# Patient Record
Sex: Female | Born: 1965 | Race: White | Hispanic: No | Marital: Married | Smoking: Current every day smoker
Health system: Southern US, Community
[De-identification: ages and names within clinical notes are randomized; demographics above are authoritative.]

## PROBLEM LIST (undated history)

## (undated) DIAGNOSIS — F329 Major depressive disorder, single episode, unspecified: Secondary | ICD-10-CM

## (undated) DIAGNOSIS — F419 Anxiety disorder, unspecified: Secondary | ICD-10-CM

## (undated) DIAGNOSIS — F32A Depression, unspecified: Secondary | ICD-10-CM

## (undated) HISTORY — DX: Major depressive disorder, single episode, unspecified: F32.9

## (undated) HISTORY — DX: Depression, unspecified: F32.A

## (undated) HISTORY — PX: ECTOPIC PREGNANCY SURGERY: SHX613

## (undated) HISTORY — DX: Anxiety disorder, unspecified: F41.9

---

## 2001-09-22 ENCOUNTER — Emergency Department (HOSPITAL_COMMUNITY): Admission: EM | Admit: 2001-09-22 | Discharge: 2001-09-22 | Payer: Self-pay | Admitting: Emergency Medicine

## 2001-09-22 ENCOUNTER — Encounter: Payer: Self-pay | Admitting: Emergency Medicine

## 2005-07-01 ENCOUNTER — Encounter: Payer: Self-pay | Admitting: Emergency Medicine

## 2005-07-02 ENCOUNTER — Inpatient Hospital Stay (HOSPITAL_COMMUNITY): Admission: EM | Admit: 2005-07-02 | Discharge: 2005-07-05 | Payer: Self-pay | Admitting: Internal Medicine

## 2005-07-08 ENCOUNTER — Inpatient Hospital Stay (HOSPITAL_COMMUNITY): Admission: EM | Admit: 2005-07-08 | Discharge: 2005-07-14 | Payer: Self-pay | Admitting: Emergency Medicine

## 2007-06-21 IMAGING — CR DG ABDOMEN 2V
2 series · 2 of 2 positions shown · non-contrast
Comparison: Most recently 07/03/05

CLINICAL DATA: History resolving partial SBO.
 ABDOMEN ? 2 VIEW:

[w abdomen upright]
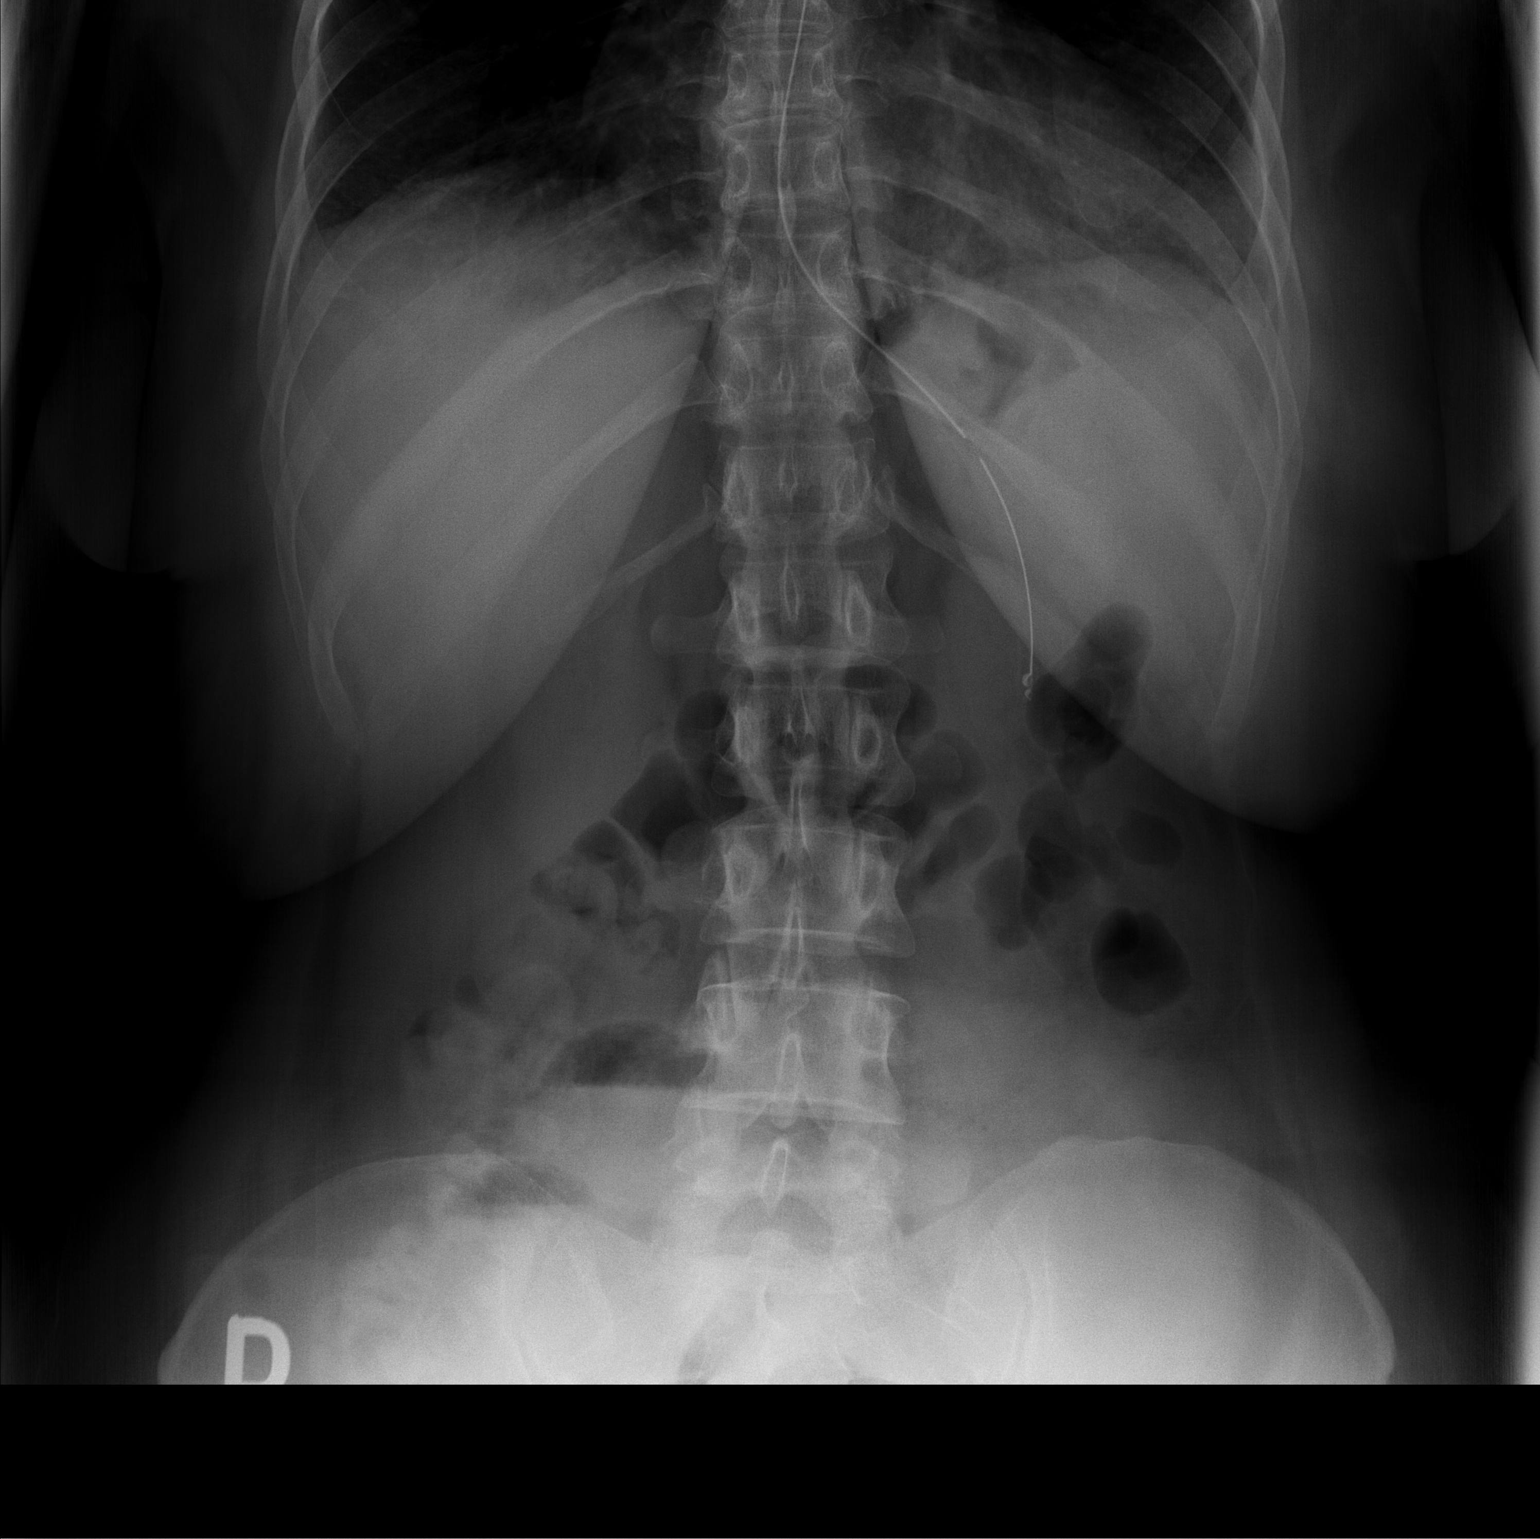

[t abdomen supine]
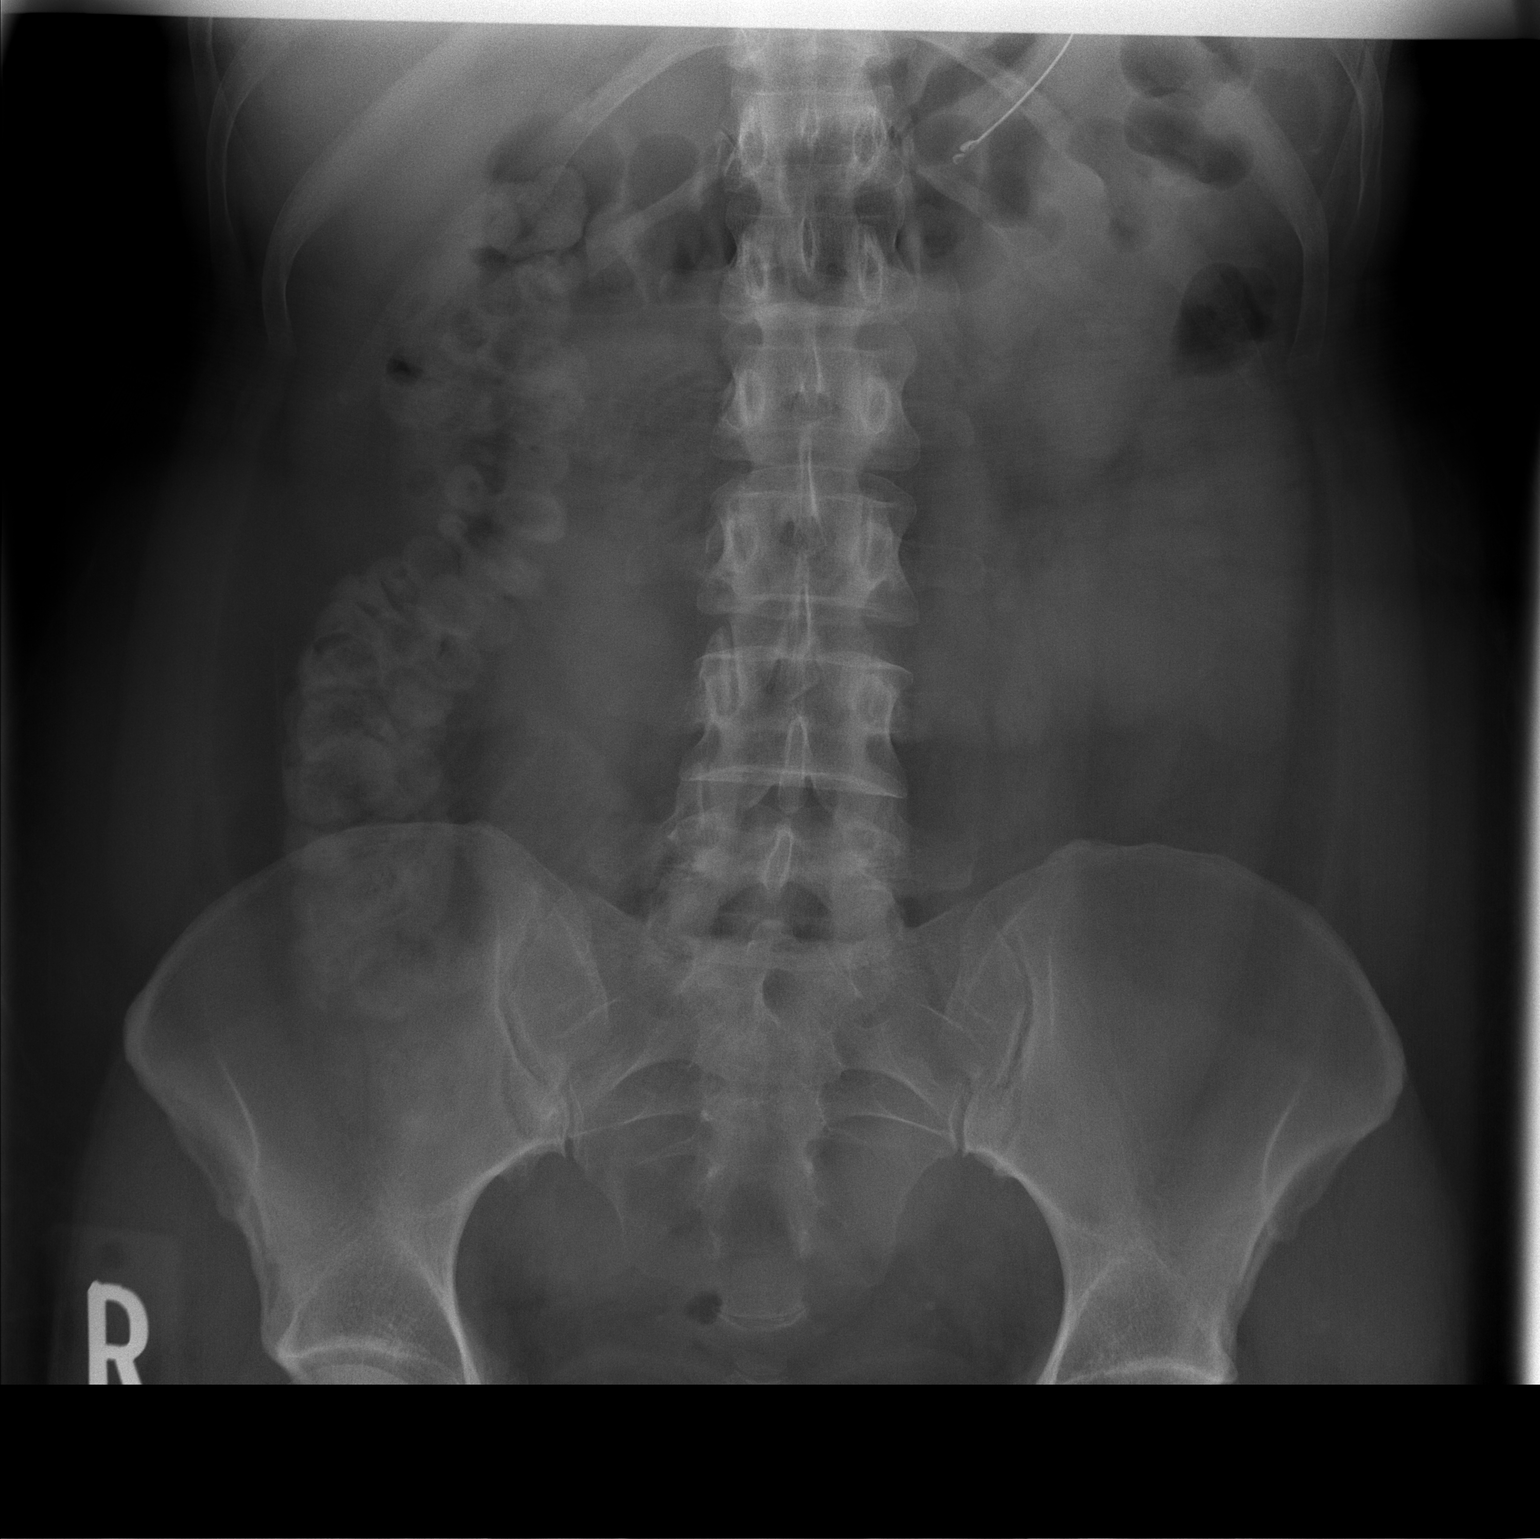

[2 of 2 positions shown; findings below may reference images not displayed]

FINDINGS: Nasogastric tube remains in the stomach.  The gas pattern is nearly normal today.  There are a few slightly prominent small bowel loops with air fluid levels but this continues to improve.  No sign of free air.  There is mild basilar atelectasis.
IMPRESSION: Continued radiographic improvement.

## 2007-06-25 IMAGING — CR DG ABDOMEN ACUTE W/ 1V CHEST
3 series · 3 of 3 positions shown · non-contrast
Comparison: none

CLINICAL DATA: Recent small bowel obstruction, onset of epigastric pain with bloating.  
 ACUTE ABDOMINAL SERIES:
 Chest - a single view of the chest shows the lungs to be clear.  The heart is within normal limits in size.  
 Abdomen - compared to films of 07/04/2005, there are scattered air-fluid levels in somewhat distended loops of small bowel.  This is suggestive of a persistent partial SBO pattern.  No free air is seen.

[w chest pa]
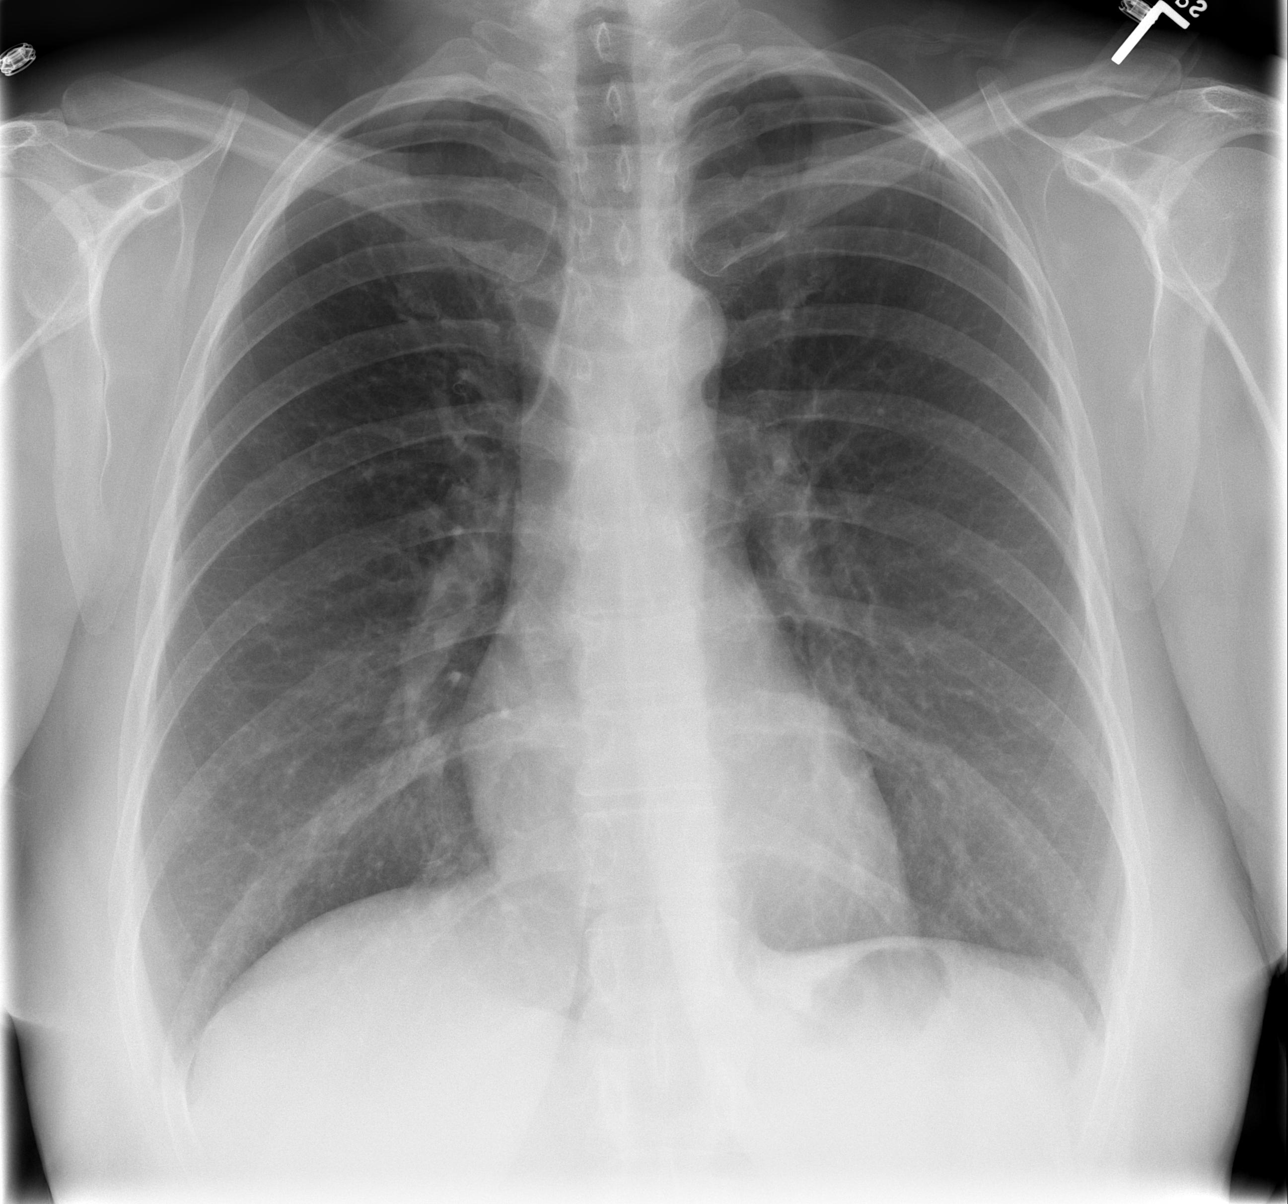

[w abdomen upright]
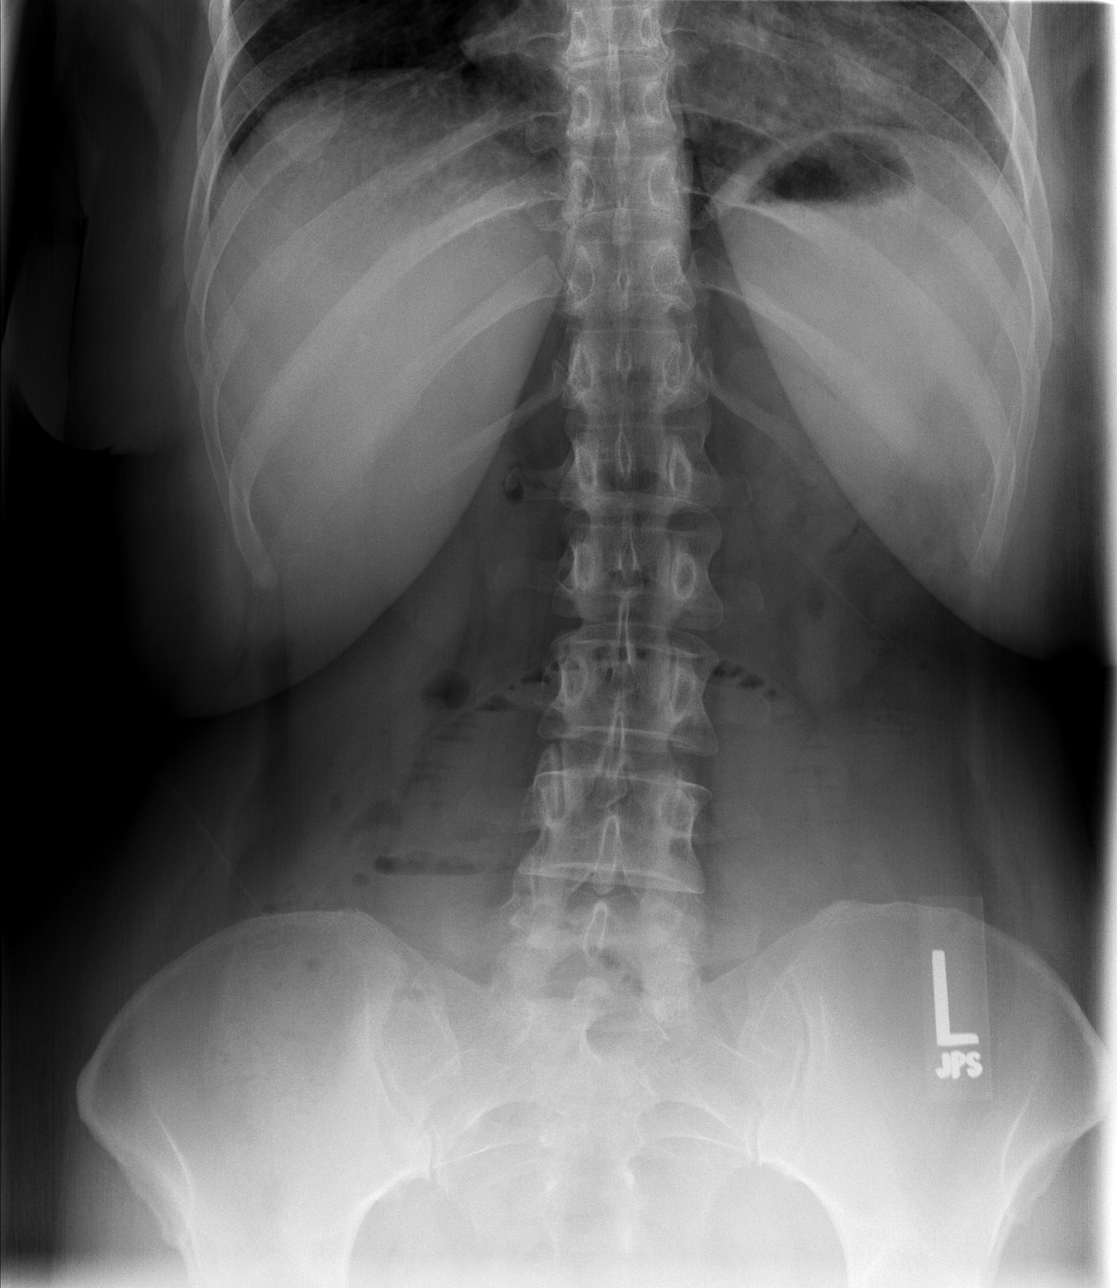

[t abdomen supine]
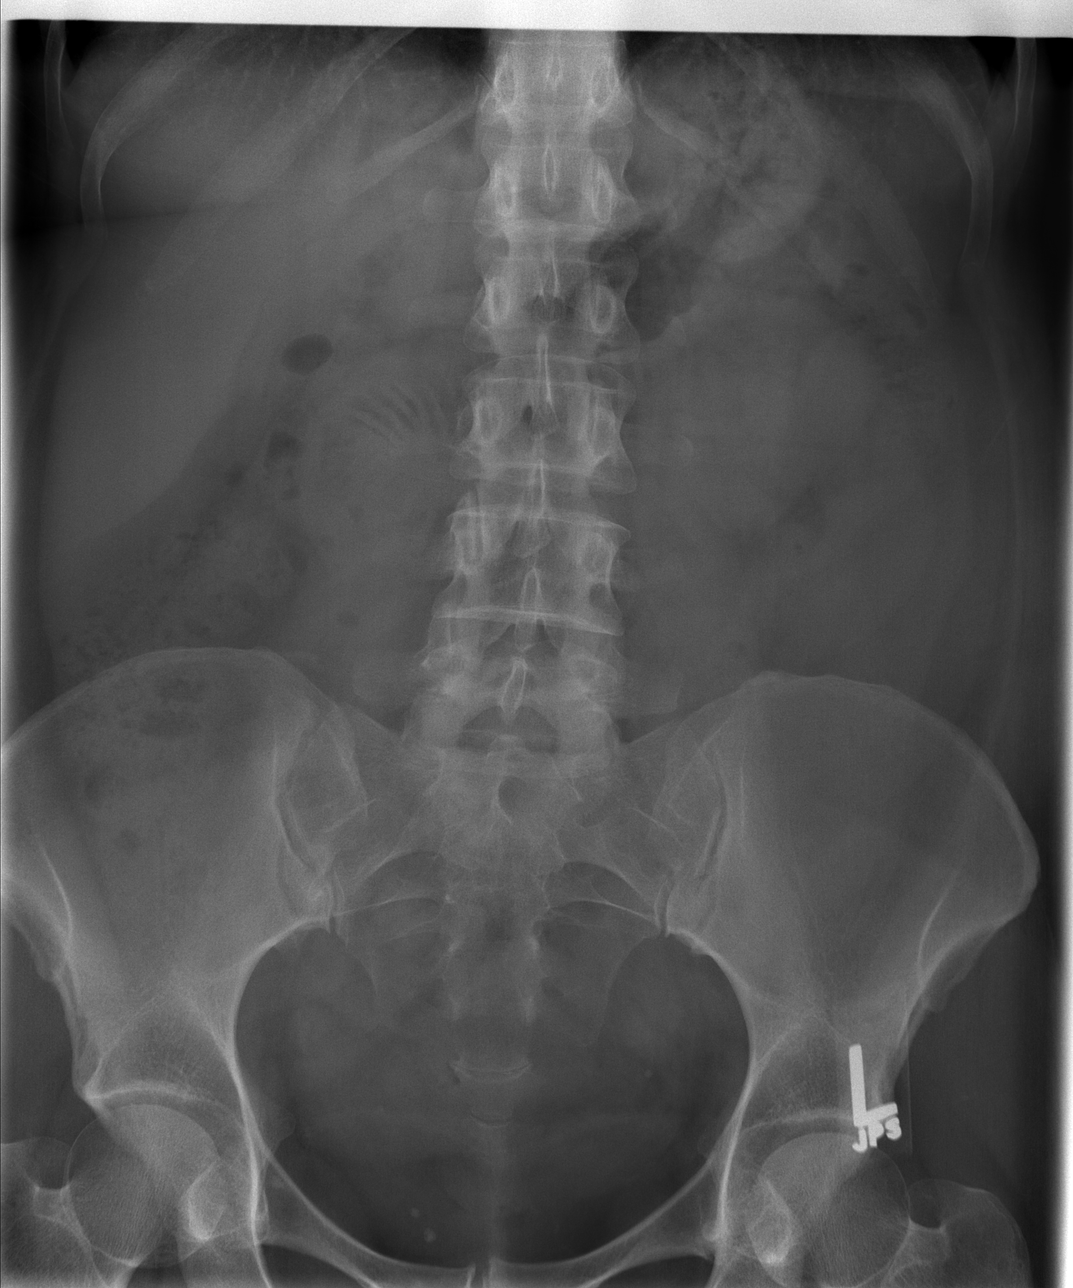

[3 of 3 positions shown; findings below may reference images not displayed]

IMPRESSION: 1.  Suspect persistent partial SBO with fluid-filled small bowel loops.  No free air. 
 2.  No active lung disease.

## 2007-06-25 IMAGING — CT CT PELVIS W/ CM
2 of 5 series · 16 of 46 positions shown, 18 images · IV contrast (APPLIED)
Comparison: Prior CT of 07/01/05.

CLINICAL DATA: Recent bowel obstruction now with similar symptoms of pain, distention, nausea, and vomiting. 
ABDOMEN CT WITH CONTRAST:
TECHNIQUE: Multidetector CT imaging of the abdomen was performed following the standard protocol during bolus administration of intravenous contrast.
Contrast:  Oral contrast and 100 cc Omnipaque 300
TECHNIQUE: Multidetector CT imaging of the pelvis was performed following the standard protocol during bolus administration of intravenous contrast.

[Series 3: abd/pelv with 5.0 b31f st · axial · 0.76mm/px · z∈[-467,-72]mm · 13 of 89 slices shown, 15 images]
[im 5/89  soft-tissue]
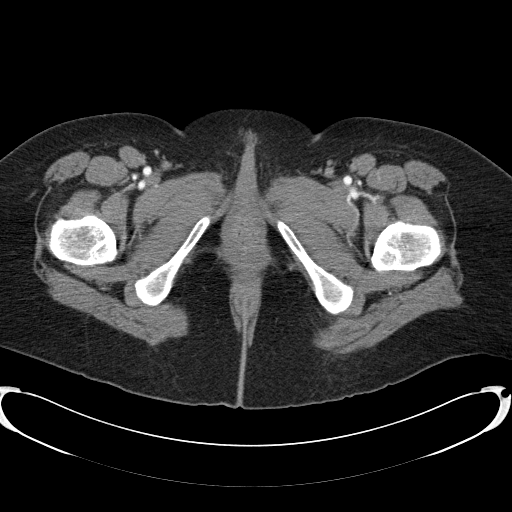
[im 5/89  bone]
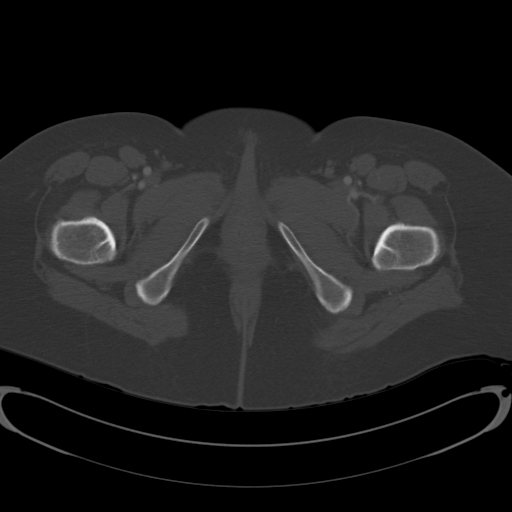
[im 14/89  soft-tissue]
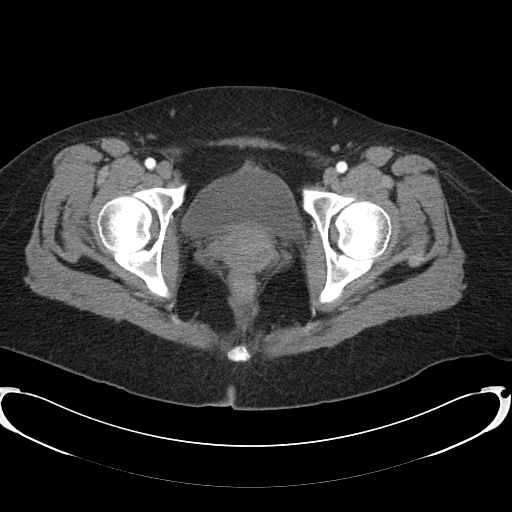
[im 19/89  soft-tissue]
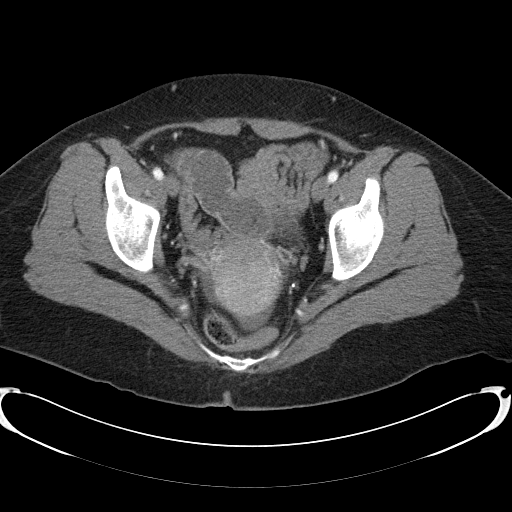
[im 24/89  soft-tissue]
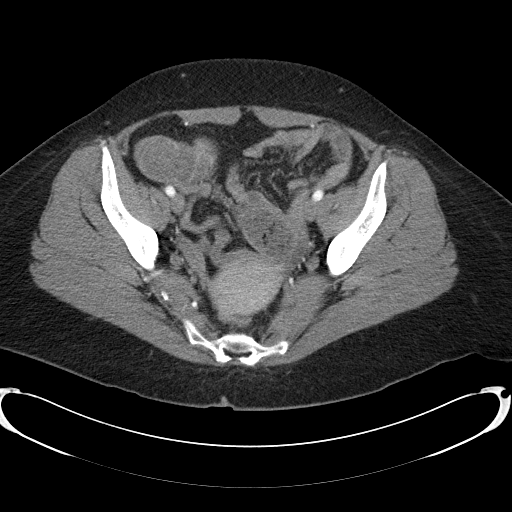
[im 33/89  soft-tissue]
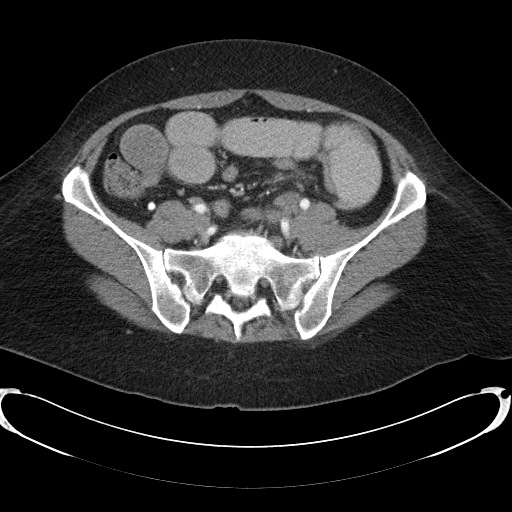
[im 38/89  soft-tissue]
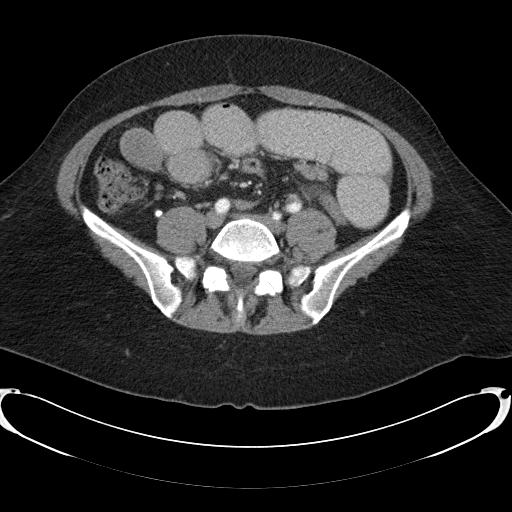
[im 47/89  soft-tissue]
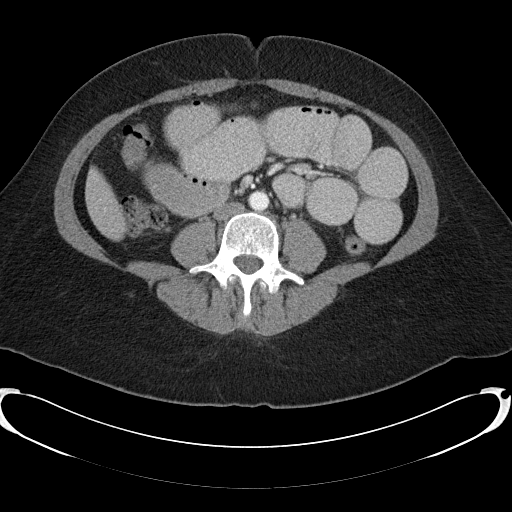
[im 51/89  soft-tissue]
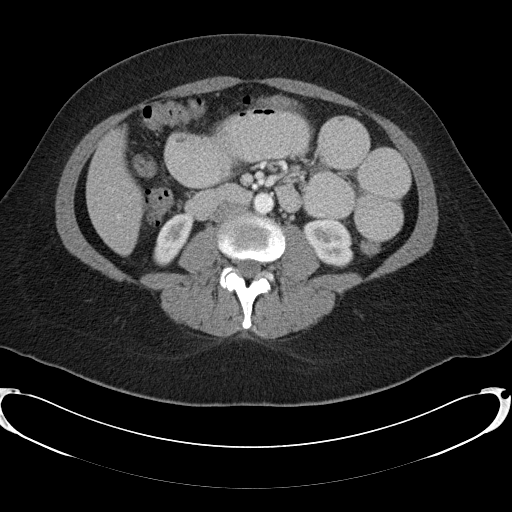
[im 56/89  soft-tissue]
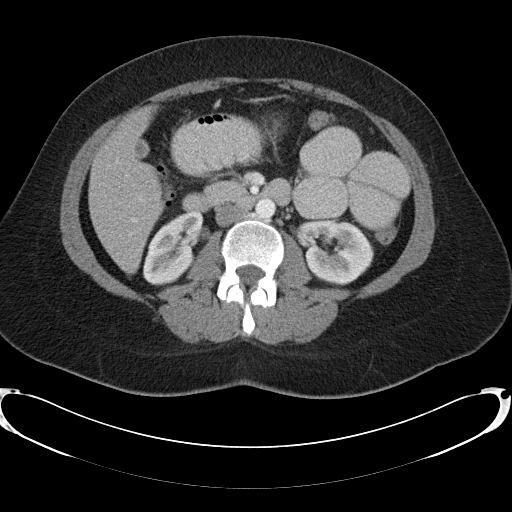
[im 56/89  bone]
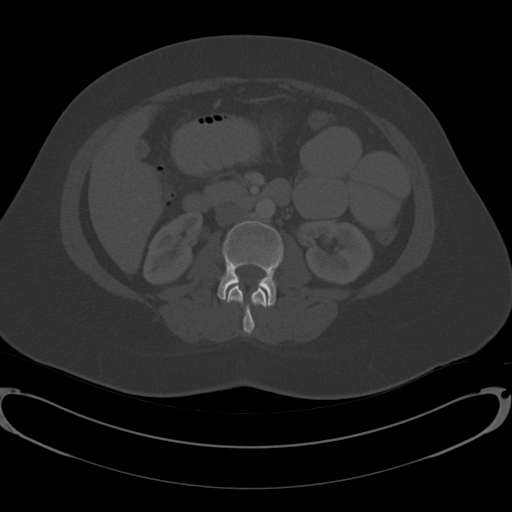
[im 65/89  soft-tissue]
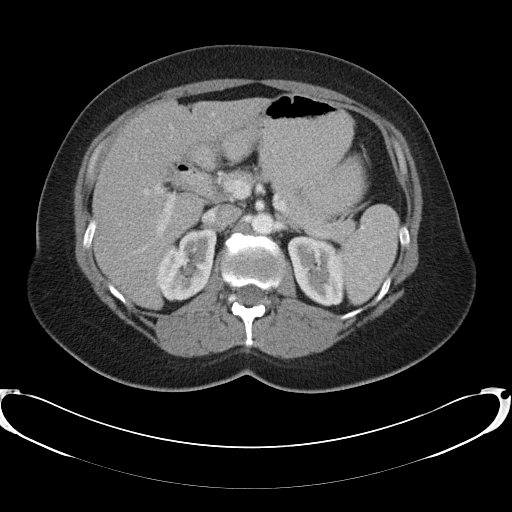
[im 70/89  soft-tissue]
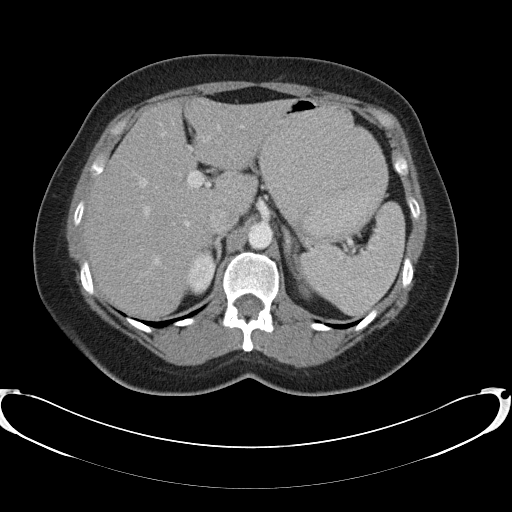
[im 75/89  soft-tissue]
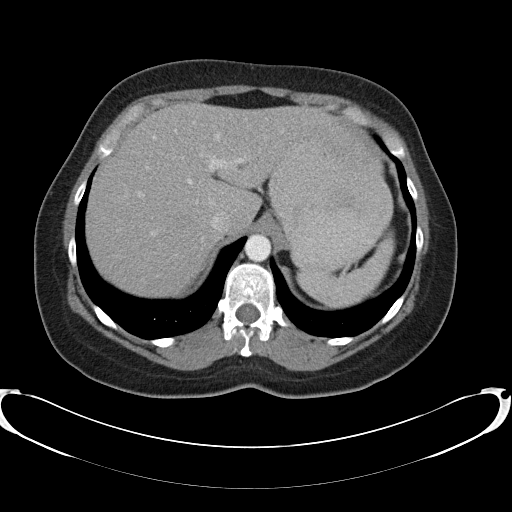
[im 84/89  soft-tissue]
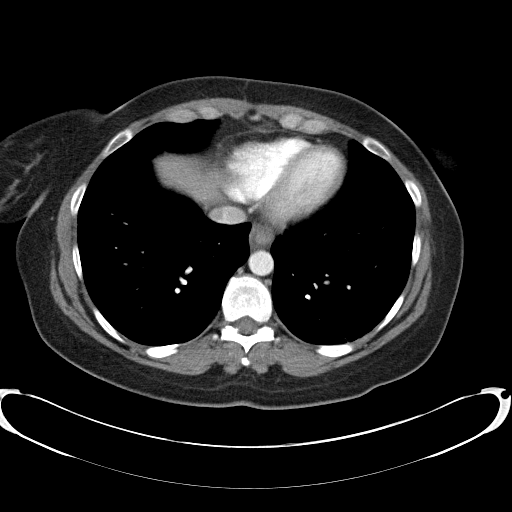

[Series 602: coronals · coronal · 0.87mm/px · 3 of 125 slices shown]
[im 42/125  soft-tissue]
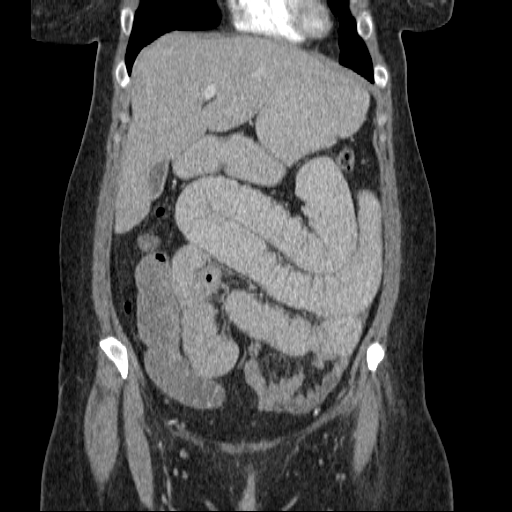
[im 56/125  soft-tissue]
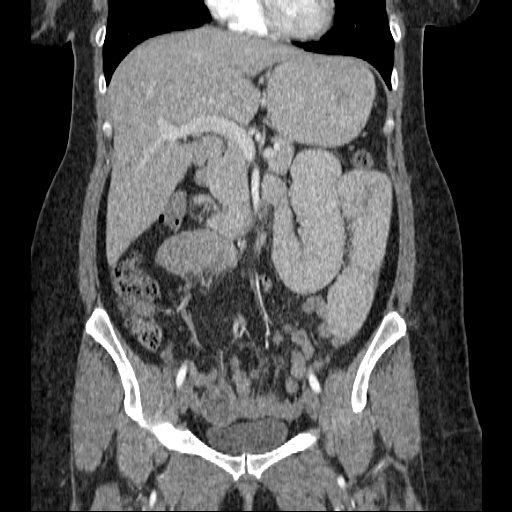
[im 69/125  soft-tissue]
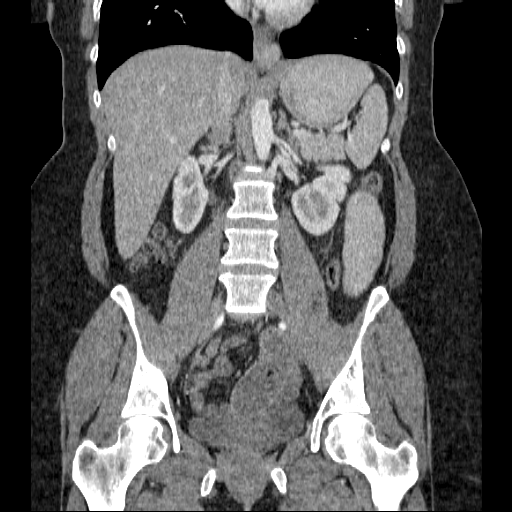

[16 of 46 positions shown; findings below may reference images not displayed]

FINDINGS: The lung bases are clear.  The liver enhances normally with no focal abnormality and no ductal dilatation is seen. There does appear to be a small hiatal hernia present.  The gallbladder is contracted and no calcified gallstones are seen.  The pancreas is normal in size with normal peripancreatic fat planes.  The adrenal glands and spleen appear normal.  The kidneys enhance normally and on delayed images the pelvocaliceal systems appear normal.  The abdominal aorta is normal in caliber.  There is dilatation of small bowel loops into the pelvis and CT of the pelvis is to be performed.
IMPRESSION: Dilated loops of small bowel consistent with partial small bowel obstruction.  CT of the pelvis to be performed. 
PELVIS CT WITH CONTRAST:
FINDINGS: Compared to the prior CT the change of caliber of the dilated small bowel is again noted to be within the left pelvic sidewall.  No definite mass is seen at that site and adhesion would be the most likely possibility.  The uterus is normal in size.  A small amount of free fluid is present within the pelvis.  The urinary bladder is decompressed.  The appendix and terminal ileum appear normal and the distal ileum is not dilated.
IMPRESSION: 1.  Partial small bowel obstruction with change of caliber in the left pelvic sidewall, very similar to the findings on initial CT of 07/01/05.
[DATE].  Small amount of free fluid in pelvis. 
3.  Terminal ileum and appendix appear normal.

## 2012-04-12 ENCOUNTER — Ambulatory Visit: Payer: BC Managed Care – PPO | Admitting: Physician Assistant

## 2012-04-12 VITALS — BP 132/84 | HR 92 | Temp 98.4°F | Resp 17 | Ht 66.5 in | Wt 200.0 lb

## 2012-04-12 DIAGNOSIS — D72829 Elevated white blood cell count, unspecified: Secondary | ICD-10-CM

## 2012-04-12 DIAGNOSIS — R300 Dysuria: Secondary | ICD-10-CM

## 2012-04-12 DIAGNOSIS — N159 Renal tubulo-interstitial disease, unspecified: Secondary | ICD-10-CM

## 2012-04-12 LAB — POCT URINALYSIS DIPSTICK: Glucose, UA: NEGATIVE

## 2012-04-12 LAB — POCT UA - MICROSCOPIC ONLY
Casts, Ur, LPF, POC: NEGATIVE
Mucus, UA: NEGATIVE

## 2012-04-12 LAB — POCT CBC
Hemoglobin: 12.8 g/dL (ref 12.2–16.2)
MCHC: 31.7 g/dL — AB (ref 31.8–35.4)
RBC: 4.23 M/uL (ref 4.04–5.48)
RDW, POC: 14 %

## 2012-04-12 MED ORDER — CEFTRIAXONE SODIUM 1 G IJ SOLR
1.0000 g | Freq: Once | INTRAMUSCULAR | Status: AC
  Administered 2012-04-12: 1 g via INTRAMUSCULAR

## 2012-04-12 MED ORDER — CIPROFLOXACIN HCL 500 MG PO TABS: 500.0000 mg | ORAL_TABLET | Freq: Two times a day (BID) | ORAL | Status: DC

## 2012-04-12 NOTE — Patient Instructions (Signed)
Pyelonephritis, Adult  Pyelonephritis is a kidney infection. A kidney infection can happen quickly, or it can last for a long time.  HOME CARE    Take your medicine (antibiotics) as told. Finish it even if you start to feel better.   Keep all doctor visits as told.   Drink enough fluids to keep your pee (urine) clear or pale yellow.   Only take medicine as told by your doctor.  GET HELP RIGHT AWAY IF:    You have a fever or lasting symptoms for more than 2-3 days.   You have a fever and your symptoms suddenly get worse.   You cannot take your medicine or drink fluids as told.   You have chills and shaking.   You feel very weak or pass out (faint).   You do not feel better after 2 days.  MAKE SURE YOU:   Understand these instructions.   Will watch your condition.   Will get help right away if you are not doing well or get worse.  Document Released: 01/31/2004 Document Revised: 06/24/2011 Document Reviewed: 06/12/2010  ExitCare Patient Information 2013 [employer].

## 2012-04-12 NOTE — Progress Notes (Signed)
Subjective:    Patient ID: Jeanette Webb, female    DOB: 1965/08/25, 47 y.o.   MRN: [id number]  HPI 47 yr old CF presents after a 6 day history of burning with urination with frequency, then last night she felt as though she had been kicked in the back. No N/V. She hasn't had fever. Cranberry juice and increasing her water intake didn't help. She hasn't had a UTI in years and no recent antibiotics.  Review of Systems  All other systems reviewed and are negative.       Objective:   Physical Exam  Nursing note and vitals reviewed. Constitutional: She is oriented to person, place, and time. She appears well-developed and well-nourished.  Not toxic-appearing  HENT:  Head: Normocephalic and atraumatic.  Cardiovascular: Normal rate, regular rhythm and normal heart sounds.   Pulmonary/Chest: Effort normal and breath sounds normal.  Abdominal: Soft.  +CVA tenderness B  Neurological: She is alert and oriented to person, place, and time.  Skin: Skin is warm and dry.  Psychiatric: She has a normal mood and affect. Her behavior is normal.    Results for orders placed in visit on 04/12/12  POCT UA - MICROSCOPIC ONLY      Result Value Range   WBC, Ur, HPF, POC TNTC     RBC, urine, microscopic 20-30     Bacteria, U Microscopic 2+     Mucus, UA neg     Epithelial cells, urine per micros 2-4     Crystals, Ur, HPF, POC neg     Casts, Ur, LPF, POC neg     Yeast, UA neg    POCT URINALYSIS DIPSTICK      Result Value Range   Color, UA yellow     Clarity, UA cloudy     Glucose, UA neg     Bilirubin, UA neg     Ketones, UA neg     Spec Grav, UA 1.015     Blood, UA large     pH, UA 6.5     Protein, UA 3+     Urobilinogen, UA 0.2     Nitrite, UA positive     Leukocytes, UA large (3+)    POCT CBC      Result Value Range   WBC 12.2 (*) 4.6 - 10.2 K/uL   Lymph, poc 2.2  0.6 - 3.4   POC LYMPH PERCENT 18.0  10 - 50 %L   MID (cbc) 0.7  0 - 0.9   POC MID % 5.7  0 - 12 %M   POC  Granulocyte 9.3 (*) 2 - 6.9   Granulocyte percent 76.3  37 - 80 %G   RBC 4.23  4.04 - 5.48 M/uL   Hemoglobin 12.8  12.2 - 16.2 g/dL   HCT, POC 40.9  81.1 - 47.9 %   MCV 95.5  80 - 97 fL   MCH, POC 30.3  27 - 31.2 pg   MCHC 31.7 (*) 31.8 - 35.4 g/dL   RDW, POC 91.4     Platelet Count, POC 284  142 - 424 K/uL   MPV 9.3  0 - 99.8 fL       Assessment & Plan:  Pyelonephritis with leukocytosis- Meds ordered this encounter  Medications  . Bearberry, Uva-Ursi, (UVA URSI PO)    Sig: Take by mouth.  . cefTRIAXone (ROCEPHIN) injection 1 g    Sig:   . ciprofloxacin (CIPRO) 500 MG tablet    Sig: Take 1  tablet (500 mg total) by mouth 2 (two) times daily.    Dispense:  20 tablet    Refill:  0    Order Specific Question:  Supervising Provider    Answer:  DOOLITTLE, ROBERT P [[id number]]  recheck in 48 hours, sooner if worse.

## 2012-04-14 LAB — URINE CULTURE: Colony Count: >=100000

## 2013-04-04 ENCOUNTER — Ambulatory Visit (INDEPENDENT_AMBULATORY_CARE_PROVIDER_SITE_OTHER): Payer: BC Managed Care – PPO | Admitting: Internal Medicine

## 2013-04-04 VITALS — BP 122/80 | HR 71 | Temp 97.7°F | Resp 18 | Ht 65.75 in | Wt 202.6 lb

## 2013-04-04 DIAGNOSIS — N912 Amenorrhea, unspecified: Secondary | ICD-10-CM

## 2013-04-04 DIAGNOSIS — R1031 Right lower quadrant pain: Secondary | ICD-10-CM

## 2013-04-04 DIAGNOSIS — N390 Urinary tract infection, site not specified: Secondary | ICD-10-CM

## 2013-04-04 DIAGNOSIS — R300 Dysuria: Secondary | ICD-10-CM

## 2013-04-04 LAB — POCT URINALYSIS DIPSTICK
Bilirubin, UA: NEGATIVE
GLUCOSE UA: NEGATIVE
NITRITE UA: POSITIVE
PH UA: 5.5
Spec Grav, UA: 1.025
Urobilinogen, UA: 0.2

## 2013-04-04 LAB — COMPREHENSIVE METABOLIC PANEL
ALT: 13 U/L (ref 0–35)
AST: 18 U/L (ref 0–37)
Albumin: 4.2 g/dL (ref 3.5–5.2)
Alkaline Phosphatase: 65 U/L (ref 39–117)
BUN: 8 mg/dL (ref 6–23)
CALCIUM: 9.1 mg/dL (ref 8.4–10.5)
CHLORIDE: 103 meq/L (ref 96–112)
CO2: 26 mEq/L (ref 19–32)
CREATININE: 0.84 mg/dL (ref 0.50–1.10)
GLUCOSE: 112 mg/dL — AB (ref 70–99)
Potassium: 4.5 mEq/L (ref 3.5–5.3)
Sodium: 137 mEq/L (ref 135–145)
Total Bilirubin: 0.3 mg/dL (ref 0.2–1.2)
Total Protein: 7.2 g/dL (ref 6.0–8.3)

## 2013-04-04 LAB — POCT CBC
Granulocyte percent: 77.6 %G (ref 37–80)
HEMATOCRIT: 43.7 % (ref 37.7–47.9)
Hemoglobin: 14.3 g/dL (ref 12.2–16.2)
Lymph, poc: 2.1 (ref 0.6–3.4)
MCH: 31.6 pg — AB (ref 27–31.2)
MCHC: 32.7 g/dL (ref 31.8–35.4)
MCV: 96.5 fL (ref 80–97)
MID (CBC): 0.7 (ref 0–0.9)
MPV: 9.7 fL (ref 0–99.8)
PLATELET COUNT, POC: 268 10*3/uL (ref 142–424)
POC Granulocyte: 9.8 — AB (ref 2–6.9)
POC LYMPH PERCENT: 17 %L (ref 10–50)
POC MID %: 5.4 % (ref 0–12)
RBC: 4.53 M/uL (ref 4.04–5.48)
RDW, POC: 14.1 %
WBC: 12.6 10*3/uL — AB (ref 4.6–10.2)

## 2013-04-04 LAB — POCT UA - MICROSCOPIC ONLY
CASTS, UR, LPF, POC: NEGATIVE
Crystals, Ur, HPF, POC: NEGATIVE
MUCUS UA: NEGATIVE
RBC, URINE, MICROSCOPIC: NEGATIVE
Yeast, UA: NEGATIVE

## 2013-04-04 LAB — FSH/LH
FSH: 28.8 m[IU]/mL
LH: 25.3 m[IU]/mL

## 2013-04-04 MED ORDER — CIPROFLOXACIN HCL 500 MG PO TABS: 500.0000 mg | ORAL_TABLET | Freq: Two times a day (BID) | ORAL | Status: DC

## 2013-04-04 NOTE — Progress Notes (Signed)
Chief Complaint  Patient presents with  . Back Pain    x 3 weeks  . Dysuria    since yesterday  . Urinary odor  . Urinary Frequency   Flank pain started through weeks ago that has progressed to include some discomfort in the right lower quadrant She has had dysuria for 24-36 hours with urinary frequency for 3-4 days There is no history of frequent UTIs although she had an episode of polynephritis last year documented in the chart She has not had periods since September 2014 has had random night sweats  No fever chills or night sweats No weight loss No dyspareunia or other gynecological problems 1 partner  Review of systems As noted above no fever chills night sweats or weight loss Chest pain No cough or shortness of breath No abdominal pain constipation or diarrhea other than pain in the right lower quadrant No history kidney stones No joint swelling or skin rash  Exam BP 122/80  Pulse 71  Temp(Src) 97.7 F (36.5 C) (Oral)  Resp 18  Ht 5' 5.75" (1.67 m)  Wt 202 lb 9.6 oz (91.899 kg)  BMI 32.95 kg/m2  SpO2 99% No acute distress Heart regular without murmur Lungs clear Abdomen tender in the right lower quadrant with negative rebound or percussion over the impression of a mass in the right lower quadrant that is somewhat tender Right flank is nontender to percussion Straight leg raise is negative  Introitus clear Uterus is anterior midposition nonenlarged and nontender There is no right adnexal mass or tenderness  Results for orders placed in visit on 04/04/13  COMPREHENSIVE METABOLIC PANEL      Result Value Ref Range   Sodium 137  135 - 145 mEq/L   Potassium 4.5  3.5 - 5.3 mEq/L   Chloride 103  96 - 112 mEq/L   CO2 26  19 - 32 mEq/L   Glucose, Bld 112 (*) 70 - 99 mg/dL   BUN 8  6 - 23 mg/dL   Creat 1.610.84  0.960.50 - 0.451.10 mg/dL   Total Bilirubin 0.3  0.2 - 1.2 mg/dL   Alkaline Phosphatase 65  39 - 117 U/L   AST 18  0 - 37 U/L   ALT 13  0 - 35 U/L   Total Protein  7.2  6.0 - 8.3 g/dL   Albumin 4.2  3.5 - 5.2 g/dL   Calcium 9.1  8.4 - 40.910.5 mg/dL  FSH/LH      Result Value Ref Range   FSH 28.8     LH 25.3    POCT UA - MICROSCOPIC ONLY      Result Value Ref Range   WBC, Ur, HPF, POC TNTC     RBC, urine, microscopic neg     Bacteria, U Microscopic trace     Mucus, UA neg     Epithelial cells, urine per micros 3-5     Crystals, Ur, HPF, POC neg     Casts, Ur, LPF, POC neg     Yeast, UA neg    POCT URINALYSIS DIPSTICK      Result Value Ref Range   Color, UA dark yellow     Clarity, UA clear     Glucose, UA neg     Bilirubin, UA neg     Ketones, UA trace     Spec Grav, UA 1.025     Blood, UA trace     pH, UA 5.5     Protein, UA trace  Urobilinogen, UA 0.2     Nitrite, UA pos     Leukocytes, UA small (1+)    POCT CBC      Result Value Ref Range   WBC 12.6 (*) 4.6 - 10.2 K/uL   Lymph, poc 2.1  0.6 - 3.4   POC LYMPH PERCENT 17.0  10 - 50 %L   MID (cbc) 0.7  0 - 0.9   POC MID % 5.4  0 - 12 %M   POC Granulocyte 9.8 (*) 2 - 6.9   Granulocyte percent 77.6  37 - 80 %G   RBC 4.53  4.04 - 5.48 M/uL   Hemoglobin 14.3  12.2 - 16.2 g/dL   HCT, POC 16.1  09.6 - 47.9 %   MCV 96.5  80 - 97 fL   MCH, POC 31.6 (*) 27 - 31.2 pg   MCHC 32.7  31.8 - 35.4 g/dL   RDW, POC 04.5     Platelet Count, POC 268  142 - 424 K/uL   MPV 9.7  0 - 99.8 fL   Impression Dysuria - Plan: POCT UA - Microscopic Only, POCT urinalysis dipstick, POCT CBC, Comprehensive metabolic panel, Urine culture, FSH/LH  Urinary tract infection, site not specified - Plan: POCT CBC, Comprehensive metabolic panel, Urine culture, FSH/LH  RLQ abdominal pain - Plan: POCT CBC, Comprehensive metabolic panel, Urine culture, FSH/LH  Amenorrhea - Plan: POCT CBC, Comprehensive metabolic panel, Urine culture, FSH/LH  these values are not diagnostic completely for menopause  Plan Urine culture Treat for urinary tract infection Needs reexamination in 7 days to check right lower  quadrant Meds ordered this encounter  Medications  . ciprofloxacin (CIPRO) 500 MG tablet    Sig: Take 1 tablet (500 mg total) by mouth 2 (two) times daily.    Dispense:  20 tablet    Refill:  0

## 2013-04-04 NOTE — Patient Instructions (Signed)
Fri  4/3 10-4 Sat  4/4 8-3 Sun  4/5 10-4

## 2013-04-06 LAB — URINE CULTURE: Colony Count: >=100000

## 2013-04-11 ENCOUNTER — Encounter: Payer: Self-pay | Admitting: Internal Medicine

## 2013-04-19 ENCOUNTER — Ambulatory Visit: Payer: BC Managed Care – PPO | Admitting: Internal Medicine

## 2013-04-19 VITALS — BP 124/74 | HR 72 | Temp 97.7°F | Resp 16 | Ht 65.5 in | Wt 202.4 lb

## 2013-04-19 DIAGNOSIS — R1031 Right lower quadrant pain: Secondary | ICD-10-CM

## 2013-04-19 DIAGNOSIS — R319 Hematuria, unspecified: Secondary | ICD-10-CM

## 2013-04-19 DIAGNOSIS — R109 Unspecified abdominal pain: Secondary | ICD-10-CM

## 2013-04-19 LAB — POCT URINALYSIS DIPSTICK
BILIRUBIN UA: NEGATIVE
Glucose, UA: NEGATIVE
KETONES UA: NEGATIVE
Leukocytes, UA: NEGATIVE
Nitrite, UA: NEGATIVE
Protein, UA: NEGATIVE
SPEC GRAV UA: 1.025
UROBILINOGEN UA: 0.2
pH, UA: 5

## 2013-04-19 LAB — POCT UA - MICROSCOPIC ONLY
Casts, Ur, LPF, POC: NEGATIVE
Crystals, Ur, HPF, POC: NEGATIVE
MUCUS UA: POSITIVE
Yeast, UA: NEGATIVE

## 2013-04-19 NOTE — Progress Notes (Signed)
This chart was scribed for Ellamae Siaobert Doolittle, MD by Joaquin MusicKristina Sanchez-Matthews, ED Scribe. This patient was seen in room Room/bed 10 and the patient's care was started at 9:12 AM. Subjective:    Patient ID: Jeanette Webb, female    DOB: 09-19-1965, 48 y.o.   MRN: [id number] Chief Complaint  Patient presents with  . Follow-up    DOS:04/04/13 - RLQ pain still- Denies appendectomy or cholescysectomy   HPI Jeanette Webb is a 48 y.o. female [facility] F/U apt. Pt states she is still having R sided flank pain to R lower back. She states her burning and frequency has subsided. She states certain movements such as stretching, scrunching up, sleeping on R side, and while sleeping worsens the pain. Pt states the pain feels similar to a menstrual cramp when the pain begins. Pt states her last menstrual period was November 2014. Pt states her last pap smear was 2 years ago. She is unsure of any injuries that may have caused her pain but denies any recent injuries or traumas to the area. Pt denies family hx of colitis or chron's disease. She states her parents have been deceased for many years and states her family hx is limited. Pt denies unexpected weight loss, night sweats, change of appetite, nausea, emesis, and diarrhea.   Review of Systems  Constitutional: Negative for fever.  HENT: Negative.   Eyes: Negative.   Respiratory: Negative.   Cardiovascular: Negative.   Gastrointestinal: Negative for nausea, vomiting and diarrhea.  Endocrine: Negative.   Genitourinary: Positive for flank pain. Negative for dysuria, urgency, frequency and hematuria.  Musculoskeletal: Positive for back pain.  Allergic/Immunologic: Negative.   Neurological: Negative for dizziness.  Hematological: Negative.   Psychiatric/Behavioral: Negative.    Objective:   Physical Exam  Nursing note and vitals reviewed. Constitutional: She is oriented to person, place, and time. She appears well-developed  and well-nourished. No distress.  HENT:  Head: Normocephalic.  Eyes: EOM are normal. Pupils are equal, round, and reactive to light.  Neck: Neck supple.  Cardiovascular: Normal rate and intact distal pulses.   Pulmonary/Chest: Effort normal.  Abdominal: Soft. Bowel sounds are normal. She exhibits no mass. There is no rebound and no guarding.  Tender deep in the RLQ without mass or rebound.   Genitourinary:  Introitus clear. Os slightly friable. No discharge. Uterus mid-position, non-tender and non-enlarged. No adnexal masses or tenderness. Normal rectal exam.  Musculoskeletal: She exhibits no edema.  Lumbar area and R hip are both tender to palpation and have full ROM without pain.   Neurological: She is alert and oriented to person, place, and time.  Psychiatric: She has a normal mood and affect. Her behavior is normal. Thought content normal.   Results for orders placed in visit on 04/19/13  POCT URINALYSIS DIPSTICK      Result Value Ref Range   Color, UA yellow     Clarity, UA clear     Glucose, UA neg     Bilirubin, UA neg     Ketones, UA neg     Spec Grav, UA 1.025     Blood, UA small     pH, UA 5.0     Protein, UA neg     Urobilinogen, UA 0.2     Nitrite, UA neg     Leukocytes, UA Negative    POCT UA - MICROSCOPIC ONLY      Result Value Ref Range   WBC, Ur, HPF, POC 0-3  RBC, urine, microscopic 2-5     Bacteria, U Microscopic 1+     Mucus, UA positive     Epithelial cells, urine per micros 3-5     Crystals, Ur, HPF, POC neg     Casts, Ur, LPF, POC neg     Yeast, UA neg      Triage Vitals:BP 124/74  Pulse 72  Temp(Src) 97.7 F (36.5 C) (Oral)  Resp 16  Ht 5' 5.5" (1.664 m)  Wt 202 lb 6.4 oz (91.808 kg)  BMI 33.16 kg/m2  SpO2 100% Assessment & Plan:    I have completed the patient encounter in its entirety as documented by the scribe, with editing by me where necessary. Robert P. Merla Richesoolittle, M.D. RLQ abdominal pain - Right flank pain - Hematuria  -  With persistent pain over 2-3 mos, needs scan to r/o stone or obstr R kidney/ureter, mass lesion RLQ/flank

## 2013-04-21 LAB — PAP IG AND HPV HIGH-RISK: HPV DNA HIGH RISK: NOT DETECTED

## 2013-04-24 ENCOUNTER — Encounter: Payer: Self-pay | Admitting: Internal Medicine

## 2013-04-27 ENCOUNTER — Other Ambulatory Visit: Payer: Self-pay | Admitting: Emergency Medicine

## 2013-04-27 ENCOUNTER — Other Ambulatory Visit: Payer: Self-pay | Admitting: Internal Medicine

## 2013-04-27 ENCOUNTER — Ambulatory Visit
Admission: RE | Admit: 2013-04-27 | Discharge: 2013-04-27 | Disposition: A | Discharge status: Home / Self Care | Payer: BC Managed Care – HMO | Source: Ambulatory Visit | Attending: Internal Medicine | Admitting: Internal Medicine

## 2013-04-27 DIAGNOSIS — N926 Irregular menstruation, unspecified: Secondary | ICD-10-CM

## 2013-04-27 DIAGNOSIS — R109 Unspecified abdominal pain: Secondary | ICD-10-CM

## 2013-04-27 DIAGNOSIS — R319 Hematuria, unspecified: Secondary | ICD-10-CM

## 2013-04-27 DIAGNOSIS — R1031 Right lower quadrant pain: Secondary | ICD-10-CM

## 2013-04-27 LAB — PREGNANCY, URINE: PREG TEST UR: NEGATIVE

## 2013-04-27 MED ORDER — IOHEXOL 300 MG/ML  SOLN
125.0000 mL | Freq: Once | INTRAMUSCULAR | Status: AC | PRN
  Administered 2013-04-27: 125 mL via INTRAVENOUS

## 2013-04-28 ENCOUNTER — Telehealth: Payer: Self-pay

## 2013-04-28 NOTE — Telephone Encounter (Signed)
Pt looking for CT results- reported under imaging. Please advise.

## 2013-04-28 NOTE — Telephone Encounter (Signed)
Pt is looking for ct results  [phone]

## 2013-07-05 ENCOUNTER — Ambulatory Visit (INDEPENDENT_AMBULATORY_CARE_PROVIDER_SITE_OTHER): Payer: BC Managed Care – PPO

## 2013-07-05 ENCOUNTER — Ambulatory Visit (INDEPENDENT_AMBULATORY_CARE_PROVIDER_SITE_OTHER): Payer: BC Managed Care – PPO | Admitting: Emergency Medicine

## 2013-07-05 VITALS — BP 110/84 | HR 60 | Temp 98.1°F | Resp 18 | Ht 66.0 in | Wt 200.0 lb

## 2013-07-05 DIAGNOSIS — M25579 Pain in unspecified ankle and joints of unspecified foot: Secondary | ICD-10-CM

## 2013-07-05 DIAGNOSIS — M25571 Pain in right ankle and joints of right foot: Secondary | ICD-10-CM

## 2013-07-05 DIAGNOSIS — S92309A Fracture of unspecified metatarsal bone(s), unspecified foot, initial encounter for closed fracture: Secondary | ICD-10-CM

## 2013-07-05 MED ORDER — ACETAMINOPHEN-CODEINE #3 300-30 MG PO TABS: 1.0000 | ORAL_TABLET | ORAL | Status: DC | PRN

## 2013-07-05 NOTE — Progress Notes (Signed)
[facility] [address], [place] [id number] [phone]  Date:  07/05/2013   Name:  Jeanette PernaCarole Webb   DOB:  September 22, 1965   MRN:  [id number]  PCP:  No PCP Per Patient    Chief Complaint: Ankle Pain   History of Present Illness:  Jeanette Webb is a 48 y.o. very pleasant female patient who presents with the following:  Injured when she stepped off a curb on Sunday and suffered an inversion injury.  Pain, swelling and ecchymosis have increased. Pain increases with ambulation.  No prior ankle injury.  No relief of pain with elevation and ibuprofen Denies other complaint or health concern today.  There are no active problems to display for this patient.   Past Medical History  Diagnosis Date  . Depression   . Anxiety     Past Surgical History  Procedure Laterality Date  . Ectopic pregnancy surgery      History  Substance Use Topics  . Smoking status: Current Every Day Smoker -- 1.00 packs/day for 25 years    Types: Cigarettes  . Smokeless tobacco: Not on file  . Alcohol Use: 1.8 oz/week    3 Glasses of wine per week    Family History  Problem Relation Age of Onset  . Heart disease Father   . Diabetes Maternal Grandmother   . Heart disease Paternal Grandmother   . Diabetes Paternal Grandfather     No Known Allergies  Medication list has been reviewed and updated.  No current outpatient prescriptions on file prior to visit.   No current facility-administered medications on file prior to visit.    Review of Systems:  As per HPI, otherwise negative.    Physical Examination: Filed Vitals:   07/05/13 1103  BP: 110/84  Pulse: 60  Temp: 98.1 F (36.7 C)  Resp: 18   Filed Vitals:   07/05/13 1103  Height: 5\' 6"  (1.676 m)  Weight: 200 lb (90.719 kg)   Body mass index is 32.3 kg/(m^2). Ideal Body Weight: Weight in (lb) to have BMI = 25: 154.6   GEN: WDWN, NAD, Non-toxic, Alert & Oriented x 3 HEENT: Atraumatic, Normocephalic.  Ears  and Nose: No external deformity. EXTR: No clubbing/cyanosis/edema NEURO: Normal gait.  PSYCH: Normally interactive. Conversant. Not depressed or anxious appearing.  Calm demeanor.  Right foot:  Swollen and ecchymotic forefoot.  Marked tenderness over base of fifth MT RIGHT ankle;  Not tender or swollen.  No limitation to PROM.  Assessment and Plan: Fracture fifth MT tyl #3 Ortho  Signed,  Phillips OdorJeffery Anderson, MD   [facility] reading (PRIMARY) by  Dr. Dareen PianoAnderson.  Fracture base fifth MT on foot.  Ankle negative.

## 2013-07-05 NOTE — Patient Instructions (Signed)
Metatarsal Fracture, Undisplaced  A metatarsal fracture is a break in the bone(s) of the foot. These are the bones of the foot that connect your toes to the bones of the ankle.  DIAGNOSIS   The diagnoses of these fractures are usually made with X-rays. If there are problems in the forefoot and x-rays are normal a later bone scan will usually make the diagnosis.   TREATMENT AND HOME CARE INSTRUCTIONS  · Treatment may or may not include a cast or walking shoe. When casts are needed the use is usually for short periods of time so as not to slow down healing with muscle wasting (atrophy).  · Activities should be stopped until further advised by your caregiver.  · Wear shoes with adequate shock absorbing capabilities and stiff soles.  · Alternative exercise may be undertaken while waiting for healing. These may include bicycling and swimming, or as your caregiver suggests.  · It is important to keep all follow-up visits or specialty referrals. The failure to keep these appointments could result in improper bone healing and chronic pain or disability.  · Warning: Do not drive a car or operate a motor vehicle until your caregiver specifically tells you it is safe to do so.  IF YOU DO NOT HAVE A CAST OR SPLINT:  · You may walk on your injured foot as tolerated or advised.  · Do not put any weight on your injured foot for as long as directed by your caregiver. Slowly increase the amount of time you walk on the foot as the pain allows or as advised.  · Use crutches until you can bear weight without pain. A gradual increase in weight bearing may help.  · Apply ice to the injury for 15-20 minutes each hour while awake for the first 2 days. Put the ice in a plastic bag and place a towel between the bag of ice and your skin.  · Only take over-the-counter or prescription medicines for pain, discomfort, or fever as directed by your caregiver.  SEEK IMMEDIATE MEDICAL CARE IF:   · Your cast gets damaged or breaks.  · You have  continued severe pain or more swelling than you did before the cast was put on, or the pain is not controlled with medications.  · Your skin or nails below the injury turn blue or grey, or feel cold or numb.  · There is a bad smell, or new stains or pus-like (purulent) drainage coming from the cast.  MAKE SURE YOU:   · Understand these instructions.  · Will watch your condition.  · Will get help right away if you are not doing well or get worse.  Document Released: 09/14/2001 Document Revised: 03/17/2011 Document Reviewed: 08/06/2007  [employer]® Patient Information ©2015 [employer]. This information is not intended to replace advice given to you by your health care Kelcee Bjorn. Make sure you discuss any questions you have with your health care Kurk Corniel.

## 2015-05-08 ENCOUNTER — Ambulatory Visit (INDEPENDENT_AMBULATORY_CARE_PROVIDER_SITE_OTHER): Payer: BLUE CROSS/BLUE SHIELD | Admitting: Family Medicine

## 2015-05-08 ENCOUNTER — Ambulatory Visit (INDEPENDENT_AMBULATORY_CARE_PROVIDER_SITE_OTHER): Payer: BLUE CROSS/BLUE SHIELD

## 2015-05-08 VITALS — BP 104/84 | HR 77 | Temp 98.4°F | Resp 16 | Ht 66.0 in | Wt 212.4 lb

## 2015-05-08 DIAGNOSIS — K5900 Constipation, unspecified: Secondary | ICD-10-CM | POA: Diagnosis not present

## 2015-05-08 DIAGNOSIS — R109 Unspecified abdominal pain: Secondary | ICD-10-CM

## 2015-05-08 LAB — POCT URINALYSIS DIP (MANUAL ENTRY)
Bilirubin, UA: NEGATIVE
Glucose, UA: NEGATIVE
Ketones, POC UA: NEGATIVE
LEUKOCYTES UA: NEGATIVE
Nitrite, UA: NEGATIVE
Spec Grav, UA: >=1.03
UROBILINOGEN UA: 0.2
pH, UA: 5

## 2015-05-08 LAB — POCT CBC
GRANULOCYTE PERCENT: 68.4 % (ref 37–80)
HEMATOCRIT: 42.7 % (ref 37.7–47.9)
Hemoglobin: 15.6 g/dL (ref 12.2–16.2)
Lymph, poc: 2.5 (ref 0.6–3.4)
MCH: 33 pg — AB (ref 27–31.2)
MCHC: 36.6 g/dL — AB (ref 31.8–35.4)
MCV: 90.2 fL (ref 80–97)
MID (CBC): 0.6 (ref 0–0.9)
MPV: 7.8 fL (ref 0–99.8)
POC Granulocyte: 6.8 (ref 2–6.9)
POC LYMPH PERCENT: 25.3 %L (ref 10–50)
POC MID %: 6.3 % (ref 0–12)
Platelet Count, POC: 243 10*3/uL (ref 142–424)
RBC: 4.73 M/uL (ref 4.04–5.48)
RDW, POC: 13.4 %
WBC: 9.9 10*3/uL (ref 4.6–10.2)

## 2015-05-08 LAB — POC MICROSCOPIC URINALYSIS (UMFC)

## 2015-05-08 NOTE — Progress Notes (Signed)
Patient ID: Jeanette Webb, female    DOB: 1965-05-17  Age: 50 y.o. MRN: [id number]  Chief Complaint  Patient presents with  . Back Pain    lower left side back pain, says she is worried its her kidney    Subjective:   50 year old lady who has been having probably some low-grade pain in her left flank over the last week or 2, but became especially noticeable last night with pain in the left flank radiating around into the left side of the abdomen, not down to the groin area. No radiating down the leg. No specific injury but she has been doing a lot of yard work recently. She does have a history of having had one kidney infection in the past that didn't seem to really involve the bladder much but did give her a kidney level infection that cause similar symptoms. She's not had any urinary symptoms this time. She is not been febrile though felt a little flushed in the room today she said. She is not on any regular medications. Is not employed. Seems to stay pretty active however.   Current allergies, medications, problem list, past/family and social histories reviewed.  Objective:  BP 104/84 mmHg  Pulse 77  Temp(Src) 98.4 F (36.9 C) (Oral)  Resp 16  Ht 5\' 6"  (1.676 m)  Wt 212 lb 6.4 oz (96.344 kg)  BMI 34.30 kg/m2  SpO2 97%  No major acute distress. Overweight lady. Abdomen is soft without organomegaly or masses or significant tenderness. She is a little tender in the left flank radiating around to the lateral abdomen. No tenderness down the spine.Good range of motion of back.  Assessment & Plan:   Assessment: 1. Flank pain   2. Constipation, unspecified constipation type       Plan: Check urinalysis and proceed from there.  Orders Placed This Encounter  Procedures  . DG Abd 2 Views    Order Specific Question:  Reason for Exam (SYMPTOM  OR DIAGNOSIS REQUIRED)    Answer:  left flank pain    Order Specific Question:  Is the patient pregnant?    Answer:  No    Order Specific  Question:  Preferred imaging location?    Answer:  External  . POCT Microscopic Urinalysis ([facility])  . POCT urinalysis dipstick  . POCT CBC    No orders of the defined types were placed in this encounter.   Results for orders placed or performed in visit on 05/08/15  POCT Microscopic Urinalysis ([facility])  Result Value Ref Range   WBC,UR,HPF,POC None None WBC/hpf   RBC,UR,HPF,POC Few (A) None RBC/hpf   Bacteria Few (A) None, Too numerous to count   Mucus Present (A) Absent   Epithelial Cells, UR Per Microscopy Few (A) None, Too numerous to count cells/hpf  POCT urinalysis dipstick  Result Value Ref Range   Color, UA yellow yellow   Clarity, UA clear clear   Glucose, UA negative negative   Bilirubin, UA negative negative   Ketones, POC UA negative negative   Spec Grav, UA >=1.030    Blood, UA small (A) negative   pH, UA 5.0    Protein Ur, POC trace (A) negative   Urobilinogen, UA 0.2    Nitrite, UA Negative Negative   Leukocytes, UA Negative Negative  POCT CBC  Result Value Ref Range   WBC 9.9 4.6 - 10.2 K/uL   Lymph, poc 2.5 0.6 - 3.4   POC LYMPH PERCENT 25.3 10 - 50 %L  MID (cbc) 0.6 0 - 0.9   POC MID % 6.3 0 - 12 %M   POC Granulocyte 6.8 2 - 6.9   Granulocyte percent 68.4 37 - 80 %G   RBC 4.73 4.04 - 5.48 M/uL   Hemoglobin 15.6 12.2 - 16.2 g/dL   HCT, POC 16.142.7 09.637.7 - 47.9 %   MCV 90.2 80 - 97 fL   MCH, POC 33.0 (A) 27 - 31.2 pg   MCHC 36.6 (A) 31.8 - 35.4 g/dL   RDW, POC 04.513.4 %   Platelet Count, POC 243 142 - 424 K/uL   MPV 7.8 0 - 99.8 fL    Results for orders placed or performed in visit on 05/08/15  POCT Microscopic Urinalysis ([facility])  Result Value Ref Range   WBC,UR,HPF,POC None None WBC/hpf   RBC,UR,HPF,POC Few (A) None RBC/hpf   Bacteria Few (A) None, Too numerous to count   Mucus Present (A) Absent   Epithelial Cells, UR Per Microscopy Few (A) None, Too numerous to count cells/hpf  POCT urinalysis dipstick  Result Value Ref Range   Color, UA yellow  yellow   Clarity, UA clear clear   Glucose, UA negative negative   Bilirubin, UA negative negative   Ketones, POC UA negative negative   Spec Grav, UA >=1.030    Blood, UA small (A) negative   pH, UA 5.0    Protein Ur, POC trace (A) negative   Urobilinogen, UA 0.2    Nitrite, UA Negative Negative   Leukocytes, UA Negative Negative  POCT CBC  Result Value Ref Range   WBC 9.9 4.6 - 10.2 K/uL   Lymph, poc 2.5 0.6 - 3.4   POC LYMPH PERCENT 25.3 10 - 50 %L   MID (cbc) 0.6 0 - 0.9   POC MID % 6.3 0 - 12 %M   POC Granulocyte 6.8 2 - 6.9   Granulocyte percent 68.4 37 - 80 %G   RBC 4.73 4.04 - 5.48 M/uL   Hemoglobin 15.6 12.2 - 16.2 g/dL   HCT, POC 40.942.7 81.137.7 - 47.9 %   MCV 90.2 80 - 97 fL   MCH, POC 33.0 (A) 27 - 31.2 pg   MCHC 36.6 (A) 31.8 - 35.4 g/dL   RDW, POC 91.413.4 %   Platelet Count, POC 243 142 - 424 K/uL   MPV 7.8 0 - 99.8 fL     X-ray read as normal. I still wonder if the stool in the left colon a be adding to her pain. We'll try and have her take a little laxative.   Patient Instructions   Drink plenty of fluids  Take some MiraLAX as needed to try and get your bowels a little on the loose side to clean you out  In the event of worsening get rechecked    IF you received an x-ray today, you will receive an invoice from [facility]. Please contact [facility] at [phone] with questions or concerns regarding your invoice.   IF you received labwork today, you will receive an invoice from [facility]. Please contact [facility] at [phone] with questions or concerns regarding your invoice.   Our billing staff will not be able to assist you with questions regarding bills from these companies.  You will be contacted with the lab results as soon as they are available. The fastest way to get your results is to activate your My Chart account. Instructions are located on the last page of this paperwork. If you  have not heard  from [place] regarding the results in 2 weeks, please contact this office.          Return if symptoms worsen or fail to improve.   HOPPER,DAVID, MD 05/08/2015

## 2015-05-08 NOTE — Patient Instructions (Addendum)
Drink plenty of fluids  Take some MiraLAX as needed to try and get your bowels a little on the loose side to clean you out  In the event of worsening get rechecked    IF you received an x-ray today, you will receive an invoice from [facility]. Please contact [facility] at [phone] with questions or concerns regarding your invoice.   IF you received labwork today, you will receive an invoice from [facility]. Please contact [facility] at [phone] with questions or concerns regarding your invoice.   Our billing staff will not be able to assist you with questions regarding bills from these companies.  You will be contacted with the lab results as soon as they are available. The fastest way to get your results is to activate your My Chart account. Instructions are located on the last page of this paperwork. If you have not heard from us regarding the results in 2 weeks, please contact this office.

## 2021-03-07 ENCOUNTER — Observation Stay (HOSPITAL_COMMUNITY)
Admission: EM | Admit: 2021-03-07 | Discharge: 2021-03-08 | Disposition: A | Discharge status: Home / Self Care | Payer: Self-pay | Attending: Internal Medicine | Admitting: Internal Medicine

## 2021-03-07 ENCOUNTER — Other Ambulatory Visit: Payer: Self-pay

## 2021-03-07 ENCOUNTER — Encounter (HOSPITAL_COMMUNITY): Payer: Self-pay

## 2021-03-07 ENCOUNTER — Emergency Department (HOSPITAL_COMMUNITY): Payer: Self-pay

## 2021-03-07 DIAGNOSIS — R55 Syncope and collapse: Secondary | ICD-10-CM | POA: Insufficient documentation

## 2021-03-07 DIAGNOSIS — R739 Hyperglycemia, unspecified: Secondary | ICD-10-CM | POA: Insufficient documentation

## 2021-03-07 DIAGNOSIS — K922 Gastrointestinal hemorrhage, unspecified: Secondary | ICD-10-CM

## 2021-03-07 DIAGNOSIS — D62 Acute posthemorrhagic anemia: Secondary | ICD-10-CM | POA: Insufficient documentation

## 2021-03-07 DIAGNOSIS — Z20822 Contact with and (suspected) exposure to covid-19: Secondary | ICD-10-CM | POA: Insufficient documentation

## 2021-03-07 DIAGNOSIS — E872 Acidosis, unspecified: Secondary | ICD-10-CM | POA: Insufficient documentation

## 2021-03-07 DIAGNOSIS — K2951 Unspecified chronic gastritis with bleeding: Secondary | ICD-10-CM | POA: Insufficient documentation

## 2021-03-07 DIAGNOSIS — F17210 Nicotine dependence, cigarettes, uncomplicated: Secondary | ICD-10-CM | POA: Insufficient documentation

## 2021-03-07 DIAGNOSIS — K449 Diaphragmatic hernia without obstruction or gangrene: Secondary | ICD-10-CM | POA: Insufficient documentation

## 2021-03-07 DIAGNOSIS — K31A0 Gastric intestinal metaplasia, unspecified: Principal | ICD-10-CM | POA: Insufficient documentation

## 2021-03-07 DIAGNOSIS — K760 Fatty (change of) liver, not elsewhere classified: Secondary | ICD-10-CM | POA: Insufficient documentation

## 2021-03-07 LAB — ABO/RH: ABO/RH(D): A POS

## 2021-03-07 LAB — COMPREHENSIVE METABOLIC PANEL
ALT: 58 U/L — ABNORMAL HIGH (ref 0–44)
AST: 49 U/L — ABNORMAL HIGH (ref 15–41)
Albumin: 3.4 g/dL — ABNORMAL LOW (ref 3.5–5.0)
Alkaline Phosphatase: 73 U/L (ref 38–126)
Anion gap: 20 — ABNORMAL HIGH (ref 5–15)
BUN: 34 mg/dL — ABNORMAL HIGH (ref 6–20)
CO2: 17 mmol/L — ABNORMAL LOW (ref 22–32)
Calcium: 9.5 mg/dL (ref 8.9–10.3)
Chloride: 99 mmol/L (ref 98–111)
Creatinine, Ser: 1.08 mg/dL — ABNORMAL HIGH (ref 0.44–1.00)
GFR, Estimated: >60 mL/min (ref >60)
Glucose, Bld: 279 mg/dL — ABNORMAL HIGH (ref 70–99)
Potassium: 3.8 mmol/L (ref 3.5–5.1)
Sodium: 136 mmol/L (ref 135–145)
Total Bilirubin: 1.5 mg/dL — ABNORMAL HIGH (ref 0.3–1.2)
Total Protein: 6.1 g/dL — ABNORMAL LOW (ref 6.5–8.1)

## 2021-03-07 LAB — CBC
HCT: 28.4 % — ABNORMAL LOW (ref 36.0–46.0)
HCT: 28 % — ABNORMAL LOW (ref 36.0–46.0)
Hemoglobin: 9.5 g/dL — ABNORMAL LOW (ref 12.0–15.0)
Hemoglobin: 9.4 g/dL — ABNORMAL LOW (ref 12.0–15.0)
MCH: 34.9 pg — ABNORMAL HIGH (ref 26.0–34.0)
MCH: 35.2 pg — ABNORMAL HIGH (ref 26.0–34.0)
MCHC: 33.5 g/dL (ref 30.0–36.0)
MCHC: 33.6 g/dL (ref 30.0–36.0)
MCV: 104.4 fL — ABNORMAL HIGH (ref 80.0–100.0)
MCV: 104.9 fL — ABNORMAL HIGH (ref 80.0–100.0)
Platelets: 168 10*3/uL (ref 150–400)
Platelets: 158 10*3/uL (ref 150–400)
RBC: 2.72 MIL/uL — ABNORMAL LOW (ref 3.87–5.11)
RBC: 2.67 MIL/uL — ABNORMAL LOW (ref 3.87–5.11)
RDW: 11.9 % (ref 11.5–15.5)
RDW: 12 % (ref 11.5–15.5)
WBC: 6.9 10*3/uL (ref 4.0–10.5)
WBC: 6.6 10*3/uL (ref 4.0–10.5)
nRBC: 0 % (ref 0.0–0.2)
nRBC: 0 % (ref 0.0–0.2)

## 2021-03-07 LAB — CBC WITH DIFFERENTIAL/PLATELET
Abs Immature Granulocytes: 0.04 10*3/uL (ref 0.00–0.07)
Basophils Absolute: 0 10*3/uL (ref 0.0–0.1)
Basophils Relative: 0 %
Eosinophils Absolute: 0 10*3/uL (ref 0.0–0.5)
Eosinophils Relative: 0 %
HCT: 35.9 % — ABNORMAL LOW (ref 36.0–46.0)
Hemoglobin: 11.6 g/dL — ABNORMAL LOW (ref 12.0–15.0)
Immature Granulocytes: 0 %
Lymphocytes Relative: 38 %
Lymphs Abs: 4.4 10*3/uL — ABNORMAL HIGH (ref 0.7–4.0)
MCH: 35.4 pg — ABNORMAL HIGH (ref 26.0–34.0)
MCHC: 32.3 g/dL (ref 30.0–36.0)
MCV: 109.5 fL — ABNORMAL HIGH (ref 80.0–100.0)
Monocytes Absolute: 0.6 10*3/uL (ref 0.1–1.0)
Monocytes Relative: 5 %
Neutro Abs: 6.3 10*3/uL (ref 1.7–7.7)
Neutrophils Relative %: 57 %
Platelets: 227 10*3/uL (ref 150–400)
RBC: 3.28 MIL/uL — ABNORMAL LOW (ref 3.87–5.11)
RDW: 11.9 % (ref 11.5–15.5)
WBC: 11.5 10*3/uL — ABNORMAL HIGH (ref 4.0–10.5)
nRBC: 0 % (ref 0.0–0.2)

## 2021-03-07 LAB — PROTIME-INR
INR: 1.1 (ref 0.8–1.2)
INR: 1.1 (ref 0.8–1.2)
Prothrombin Time: 13.7 seconds (ref 11.4–15.2)
Prothrombin Time: 14.3 seconds (ref 11.4–15.2)

## 2021-03-07 LAB — RESP PANEL BY RT-PCR (FLU A&B, COVID) ARPGX2
Influenza A by PCR: NEGATIVE
Influenza B by PCR: NEGATIVE
SARS Coronavirus 2 by RT PCR: NEGATIVE

## 2021-03-07 LAB — LACTIC ACID, PLASMA
Lactic Acid, Venous: 6.5 mmol/L (ref 0.5–1.9)
Lactic Acid, Venous: 1.3 mmol/L (ref 0.5–1.9)
Lactic Acid, Venous: 0.9 mmol/L (ref 0.5–1.9)

## 2021-03-07 LAB — POC OCCULT BLOOD, ED: Fecal Occult Bld: POSITIVE — AB

## 2021-03-07 LAB — VITAMIN B12: Vitamin B-12: 98 pg/mL — ABNORMAL LOW (ref 180–914)

## 2021-03-07 LAB — HEPATITIS PANEL, ACUTE
HCV Ab: NONREACTIVE
Hep A IgM: NONREACTIVE
Hep B C IgM: NONREACTIVE
Hepatitis B Surface Ag: NONREACTIVE

## 2021-03-07 LAB — I-STAT CHEM 8, ED
BUN: 32 mg/dL — ABNORMAL HIGH (ref 6–20)
Calcium, Ion: 1.21 mmol/L (ref 1.15–1.40)
Chloride: 105 mmol/L (ref 98–111)
Creatinine, Ser: 0.7 mg/dL (ref 0.44–1.00)
Glucose, Bld: 216 mg/dL — ABNORMAL HIGH (ref 70–99)
HCT: 29 % — ABNORMAL LOW (ref 36.0–46.0)
Hemoglobin: 9.9 g/dL — ABNORMAL LOW (ref 12.0–15.0)
Potassium: 4.3 mmol/L (ref 3.5–5.1)
Sodium: 138 mmol/L (ref 135–145)
TCO2: 23 mmol/L (ref 22–32)

## 2021-03-07 LAB — IRON AND TIBC
Iron: 205 ug/dL — ABNORMAL HIGH (ref 28–170)
Saturation Ratios: 88 % — ABNORMAL HIGH (ref 10.4–31.8)
TIBC: 232 ug/dL — ABNORMAL LOW (ref 250–450)
UIBC: 27 ug/dL

## 2021-03-07 LAB — I-STAT BETA HCG BLOOD, ED (MC, WL, AP ONLY): I-stat hCG, quantitative: <5 m[IU]/mL (ref <5)

## 2021-03-07 LAB — RETICULOCYTES
Immature Retic Fract: 12 % (ref 2.3–15.9)
RBC.: 2.69 MIL/uL — ABNORMAL LOW (ref 3.87–5.11)
Retic Count, Absolute: 64.6 10*3/uL (ref 19.0–186.0)
Retic Ct Pct: 2.4 % (ref 0.4–3.1)

## 2021-03-07 LAB — CBG MONITORING, ED: Glucose-Capillary: 108 mg/dL — ABNORMAL HIGH (ref 70–99)

## 2021-03-07 LAB — LIPASE, BLOOD: Lipase: 40 U/L (ref 11–51)

## 2021-03-07 LAB — FOLATE: Folate: 8.2 ng/mL (ref >5.9)

## 2021-03-07 LAB — TYPE AND SCREEN
ABO/RH(D): A POS
Antibody Screen: NEGATIVE

## 2021-03-07 LAB — FERRITIN: Ferritin: 309 ng/mL — ABNORMAL HIGH (ref 11–307)

## 2021-03-07 LAB — GLUCOSE, CAPILLARY: Glucose-Capillary: 180 mg/dL — ABNORMAL HIGH (ref 70–99)

## 2021-03-07 LAB — HEMOGLOBIN A1C
Hgb A1c MFr Bld: 4.9 % (ref 4.8–5.6)
Mean Plasma Glucose: 93.93 mg/dL

## 2021-03-07 LAB — HIV ANTIBODY (ROUTINE TESTING W REFLEX): HIV Screen 4th Generation wRfx: NONREACTIVE

## 2021-03-07 MED ORDER — PANTOPRAZOLE SODIUM 40 MG IV SOLR: 40.0000 mg | Freq: Two times a day (BID) | INTRAVENOUS | Status: DC

## 2021-03-07 MED ORDER — ADULT MULTIVITAMIN W/MINERALS CH
1.0000 | ORAL_TABLET | Freq: Every day | ORAL | Status: DC
  Administered 2021-03-07 – 2021-03-08 (×2): 1 via ORAL
  Filled 2021-03-07 (×2): qty 1

## 2021-03-07 MED ORDER — SODIUM CHLORIDE 0.9 % IV SOLN: INTRAVENOUS | Status: DC

## 2021-03-07 MED ORDER — CYANOCOBALAMIN 1000 MCG/ML IJ SOLN
1000.0000 ug | Freq: Once | INTRAMUSCULAR | Status: AC
  Administered 2021-03-07: 1000 ug via INTRAMUSCULAR
  Filled 2021-03-07 (×2): qty 1

## 2021-03-07 MED ORDER — SODIUM CHLORIDE 0.9 % IV BOLUS
1000.0000 mL | Freq: Once | INTRAVENOUS | Status: AC
  Administered 2021-03-07: 1000 mL via INTRAVENOUS

## 2021-03-07 MED ORDER — PANTOPRAZOLE INFUSION (NEW) - SIMPLE MED
8.0000 mg/h | INTRAVENOUS | Status: DC
  Administered 2021-03-07 – 2021-03-08 (×2): 8 mg/h via INTRAVENOUS
  Filled 2021-03-07 (×2): qty 80

## 2021-03-07 MED ORDER — ONDANSETRON HCL 4 MG/2ML IJ SOLN
4.0000 mg | Freq: Once | INTRAMUSCULAR | Status: AC
  Administered 2021-03-07: 4 mg via INTRAVENOUS
  Filled 2021-03-07: qty 2

## 2021-03-07 MED ORDER — THIAMINE HCL 100 MG PO TABS
100.0000 mg | ORAL_TABLET | Freq: Every day | ORAL | Status: DC
  Administered 2021-03-07 – 2021-03-08 (×2): 100 mg via ORAL
  Filled 2021-03-07 (×2): qty 1

## 2021-03-07 MED ORDER — VITAMIN B-12 1000 MCG PO TABS
1000.0000 ug | ORAL_TABLET | Freq: Every day | ORAL | Status: DC
  Administered 2021-03-08: 1000 ug via ORAL
  Filled 2021-03-07: qty 1

## 2021-03-07 MED ORDER — PANTOPRAZOLE SODIUM 40 MG IV SOLR
80.0000 mg | Freq: Once | INTRAVENOUS | Status: AC
  Administered 2021-03-07: 80 mg via INTRAVENOUS
  Filled 2021-03-07: qty 20

## 2021-03-07 MED ORDER — PANTOPRAZOLE 80MG IVPB - SIMPLE MED
80.0000 mg | Freq: Once | INTRAVENOUS | Status: DC
  Filled 2021-03-07 (×3): qty 100

## 2021-03-07 MED ORDER — FOLIC ACID 1 MG PO TABS
1.0000 mg | ORAL_TABLET | Freq: Every day | ORAL | Status: DC
  Administered 2021-03-07 – 2021-03-08 (×2): 1 mg via ORAL
  Filled 2021-03-07 (×2): qty 1

## 2021-03-07 MED ORDER — THIAMINE HCL 100 MG/ML IJ SOLN
100.0000 mg | Freq: Every day | INTRAMUSCULAR | Status: DC
  Filled 2021-03-07: qty 2

## 2021-03-07 MED ORDER — IOHEXOL 300 MG/ML  SOLN
100.0000 mL | Freq: Once | INTRAMUSCULAR | Status: AC | PRN
  Administered 2021-03-07: 100 mL via INTRAVENOUS

## 2021-03-07 NOTE — ED Notes (Signed)
Pt back from CT

## 2021-03-07 NOTE — H&P (Addendum)
History and Physical    Jeanette Webb WUJ:[id number] DOB: Jan 12, 1965 DOA: 03/07/2021  PCP: Patient, No Pcp Per (Inactive)  Patient coming from: Home.  Chief Complaint: Black stools.  HPI: Jeanette Webb is a 56 y.o. female with no significant past medical history presents to the ER after patient had 2 black tarry bowel movements at home and also 2 episodes of hematemesis.  While waiting in the ER patient had a near syncopal episode.  Denies any abdominal pain.  Patient did take some ibuprofen last week for cold but she states only she took 1 dose.  Admits to drinking wine and beer every day.  Has not had any GI bleed before.  Patient denies having any GI bleed previously.  Has not had any colonoscopy or EGD previously.  ED Course: In the ER patient's hemoglobin was around 9 lactic acid of 6.5.  Blood pressures in the low normal which improved with fluids.  COVID test negative.  Patient admitted for further work-up for acute GI bleed.  CT of the abdomen and pelvis did not show anything acute.  Review of Systems: As per HPI, rest all negative.   Past Medical History:  Diagnosis Date   Anxiety    Depression     Past Surgical History:  Procedure Laterality Date   ECTOPIC PREGNANCY SURGERY       reports that she has been smoking cigarettes. She has a 25.00 pack-year smoking history. She does not have any smokeless tobacco history on file. She reports current alcohol use of about 3.0 standard drinks per week. She reports that she does not use drugs.  No Known Allergies  Family History  Problem Relation Age of Onset   Heart disease Father    Diabetes Maternal Grandmother    Heart disease Paternal Grandmother    Diabetes Paternal Grandfather     Prior to Admission medications   Medication Sig Start Date End Date Taking? Authorizing Provider  Carboxymethylcellulose Sodium (DRY EYE RELIEF OP) Place 2-3 drops into both eyes 2 (two) times daily as needed (dry eyes).   Yes [provider]  ibuprofen (ADVIL) 200 MG tablet Take 400 mg by mouth every 6 (six) hours as needed for moderate pain or headache.   Yes [provider]    Physical Exam: Constitutional: Moderately built and nourished. Vitals:   03/07/21 0115 03/07/21 0130 03/07/21 0200 03/07/21 0320  BP: (!) 100/57 116/82 106/70 126/83  Pulse: 94 86 86 92  Resp: 16 20 (!) 27 19  Temp:      SpO2: 100% 100% 100% 100%  Weight:      Height:       Eyes: Anicteric no pallor. ENMT: No discharge from the ears eyes nose and mouth. Neck: No mass felt.  No neck rigidity. Respiratory: No rhonchi or crepitations. Cardiovascular: S1-S2 heard. Abdomen: Soft nontender bowel sound present. Musculoskeletal: No edema. Skin: Rash on the face.  Which patient states is chronic. Neurologic: Alert awake oriented to time place and person.  Moves all extremities. Psychiatric: Appears normal.  Normal affect.   Labs on Admission: I have personally reviewed following labs and imaging studies  CBC: Recent Labs  Lab 03/07/21 0019 03/07/21 0147  WBC 11.5*  --   NEUTROABS 6.3  --   HGB 11.6* 9.9*  HCT 35.9* 29.0*  MCV 109.5*  --   PLT 227  --    Basic Metabolic Panel: Recent Labs  Lab 03/07/21 0019 03/07/21 0147  NA 136 138  K  3.8 4.3  CL 99 105  CO2 17*  --   GLUCOSE 279* 216*  BUN 34* 32*  CREATININE 1.08* 0.70  CALCIUM 9.5  --    GFR: Estimated Creatinine Clearance: 92.3 mL/min (by C-G formula based on SCr of 0.7 mg/dL). Liver Function Tests: Recent Labs  Lab 03/07/21 0019  AST 49*  ALT 58*  ALKPHOS 73  BILITOT 1.5*  PROT 6.1*  ALBUMIN 3.4*   Recent Labs  Lab 03/07/21 0019  LIPASE 40   No results for input(s): AMMONIA in the last 168 hours. Coagulation Profile: Recent Labs  Lab 03/07/21 0019  INR 1.1   Cardiac Enzymes: No results for input(s): CKTOTAL, CKMB, CKMBINDEX, TROPONINI in the last 168 hours. BNP (last 3 results) No results for input(s): PROBNP in the last 8760  hours. HbA1C: No results for input(s): HGBA1C in the last 72 hours. CBG: No results for input(s): GLUCAP in the last 168 hours. Lipid Profile: No results for input(s): CHOL, HDL, LDLCALC, TRIG, CHOLHDL, LDLDIRECT in the last 72 hours. Thyroid Function Tests: No results for input(s): TSH, T4TOTAL, FREET4, T3FREE, THYROIDAB in the last 72 hours. Anemia Panel: No results for input(s): VITAMINB12, FOLATE, FERRITIN, TIBC, IRON, RETICCTPCT in the last 72 hours. Urine analysis:    Component Value Date/Time   BILIRUBINUR negative 05/08/2015 0910   BILIRUBINUR neg 04/19/2013 0955   KETONESUR negative 05/08/2015 0910   PROTEINUR trace (A) 05/08/2015 0910   PROTEINUR neg 04/19/2013 0955   UROBILINOGEN 0.2 05/08/2015 0910   NITRITE Negative 05/08/2015 0910   NITRITE neg 04/19/2013 0955   LEUKOCYTESUR Negative 05/08/2015 0910   Sepsis Labs: @LABRCNTIP (procalcitonin:4,lacticidven:4) ) Recent Results (from the past 240 hour(s))  Resp Panel by RT-PCR (Flu A&B, Covid) Nasopharyngeal Swab     Status: None   Collection Time: 03/07/21  1:30 AM   Specimen: Nasopharyngeal Swab; Nasopharyngeal(NP) swabs in vial transport medium  Result Value Ref Range Status   SARS Coronavirus 2 by RT PCR NEGATIVE NEGATIVE Final    Comment: (NOTE) SARS-CoV-2 target nucleic acids are NOT DETECTED.  The SARS-CoV-2 RNA is generally detectable in upper respiratory specimens during the acute phase of infection. The lowest concentration of SARS-CoV-2 viral copies this assay can detect is 138 copies/mL. A negative result does not preclude SARS-Cov-2 infection and should not be used as the sole basis for treatment or other patient management decisions. A negative result may occur with  improper specimen collection/handling, submission of specimen other than nasopharyngeal swab, presence of viral mutation(s) within the areas targeted by this assay, and inadequate number of viral copies(<138 copies/mL). A negative result  must be combined with clinical observations, patient history, and epidemiological information. The expected result is Negative.  Fact Sheet for Patients:  BloggerCourse.com  Fact Sheet for Healthcare Providers:  SeriousBroker.it  This test is no t yet approved or cleared by the [place] FDA and  has been authorized for detection and/or diagnosis of SARS-CoV-2 by FDA under an Emergency Use Authorization (EUA). This EUA will remain  in effect (meaning this test can be used) for the duration of the COVID-19 declaration under Section 564(b)(1) of the Act, 21 U.S.C.section 360bbb-3(b)(1), unless the authorization is terminated  or revoked sooner.       Influenza A by PCR NEGATIVE NEGATIVE Final   Influenza B by PCR NEGATIVE NEGATIVE Final    Comment: (NOTE) The Xpert Xpress SARS-CoV-2/FLU/RSV plus assay is intended as an aid in the diagnosis of influenza from Nasopharyngeal swab specimens and should not  be used as a sole basis for treatment. Nasal washings and aspirates are unacceptable for Xpert Xpress SARS-CoV-2/FLU/RSV testing.  Fact Sheet for Patients: BloggerCourse.com  Fact Sheet for Healthcare Providers: SeriousBroker.it  This test is not yet approved or cleared by the [place] FDA and has been authorized for detection and/or diagnosis of SARS-CoV-2 by FDA under an Emergency Use Authorization (EUA). This EUA will remain in effect (meaning this test can be used) for the duration of the COVID-19 declaration under Section 564(b)(1) of the Act, 21 U.S.C. section 360bbb-3(b)(1), unless the authorization is terminated or revoked.  Performed at [facility], [address], [place], [place] [address]      Radiological Exams on Admission: CT ABDOMEN PELVIS W CONTRAST  Result Date: 03/07/2021 CLINICAL DATA:  Abdominal pain, acute, nonlocalized.  Vomiting. EXAM:  CT ABDOMEN AND PELVIS WITH CONTRAST TECHNIQUE: Multidetector CT imaging of the abdomen and pelvis was performed using the standard protocol following bolus administration of intravenous contrast. RADIATION DOSE REDUCTION: This exam was performed according to the departmental dose-optimization program which includes automated exposure control, adjustment of the mA and/or kV according to patient size and/or use of iterative reconstruction technique. CONTRAST:  OMNIPAQUE IOHEXOL 300 MG/ML  SOLN COMPARISON:  04/27/2013 FINDINGS: Lower chest: Fall moderate-sized hiatal hernia. No acute abnormality. Hepatobiliary: Diffuse low-density throughout the liver compatible with fatty infiltration. No focal abnormality. Gallbladder unremarkable. Pancreas: No focal abnormality or ductal dilatation. Spleen: No focal abnormality.  Normal size. Adrenals/Urinary Tract: No adrenal abnormality. No focal renal abnormality. No stones or hydronephrosis. Urinary bladder is unremarkable. Stomach/Bowel: Stomach, large and small bowel grossly unremarkable. Normal appendix. Vascular/Lymphatic: Aortic atherosclerosis. No evidence of aneurysm or adenopathy. Reproductive: Uterus and adnexa unremarkable.  No mass. Other: No free fluid or free air. Musculoskeletal: No acute bony abnormality. IMPRESSION: No acute findings in the abdomen or pelvis. Hepatic steatosis. Moderate-sized hiatal hernia. Aortic atherosclerosis. Electronically Signed   By: Charlett Nose M.D.   On: 03/07/2021 02:02      Assessment/Plan Principal Problem:   Acute GI bleeding Active Problems:   Acute blood loss anemia    Acute GI bleed -suspect upper GI bleed given that patient had melanotic stools.  Patient has been started on Protonix infusion and Dr. Loreta Ave gastroenterologist on-call has been consulted.  Check serial CBC keep patient NPO.  Repeat lactic acid. Acute blood loss anemia follow CBC.  Transfuse if hemoglobin less than 7 or patient becomes  hypotensive. Macrocytic anemia -could be from alcoholism.  Check anemia panel follow CBC. Near syncopal episode likely from volume loss from GI bleed.  Monitor in telemetry.  EKG pending. Hyperglycemia check hemoglobin A1c. Elevated LFTs could be from alcohol.  CT scan did show some hepatic steatosis which could be contributing.  Check acute hepatitis panel follow LFTs. Tobacco abuse advised about quitting. Patient does drink alcohol every day we will keep patient on CIWA.   DVT prophylaxis: SCDs.  Avoiding anticoagulation in the setting of GI bleed. Code Status: Full code. Family Communication: Discussed with patient. Disposition Plan: Home when stable. Consults called: Gastroenterologist. Admission status: Observation.   Eduard Clos MD [facility] Pager [phone].  If 7PM-7AM, please contact night-coverage [email or web address] Password [facility]  03/07/2021, 3:43 AM

## 2021-03-07 NOTE — Progress Notes (Signed)
Pt received from ED. VSS. Telemetry applied. Pt oriented to room and unit. Call light in reach.  Sigifredo Pignato, RN  

## 2021-03-07 NOTE — Consult Note (Signed)
UNASSIGNED CONSULT  Reason for Consult: GI bleed Referring Physician: [facility]  Truett Perna HPI: This is a 56 year old female without any significant PMH admitted for melena, anemia, and a witnessed near syncopal episode in the ER.  She reports having two melenic stools and two coffee ground emesis episodes at home.  After reading some material on the Internet she was concerned and presented to the ER.  The patient denied any issues with abdominal pain.  In the past she did use some TUMS to help with GERD, but she did not feel that GERD was a significant issue.  She struggled with Bulimia 10 years ago, but she denies any purging at this time.  Last week she used ibuprofen once as she thought that she may be experiencing an URI.  While in the ER she was noted to have an HGB of 9.4 g/dL, which was a decline from 11.6 g/dL with IV hydration.  The patient does admit to drinking ETOH on a daily basis and her MCV is at 104.  She reports drinking since her teen years.  On average she will drink 3 glasses of wine three times per week.  Her abdominal CT scan was positive for hepatic steatosis, but no evidence of cirrhosis or portal HTN.  Her liver enzymes are mildly elevated:  AST 49, ALT 58, and TB 1.5.   Past Medical History:  Diagnosis Date   Anxiety    Depression     Past Surgical History:  Procedure Laterality Date   ECTOPIC PREGNANCY SURGERY      Family History  Problem Relation Age of Onset   Heart disease Father    Diabetes Maternal Grandmother    Heart disease Paternal Grandmother    Diabetes Paternal Grandfather     Social History:  reports that she has been smoking cigarettes. She has a 25.00 pack-year smoking history. She does not have any smokeless tobacco history on file. She reports current alcohol use of about 3.0 standard drinks per week. She reports that she does not use drugs.  Allergies: No Known Allergies  Medications: Scheduled:  folic acid  1 mg Oral Daily    multivitamin with minerals  1 tablet Oral Daily   [START ON 03/10/2021] pantoprazole  40 mg Intravenous Q12H   thiamine  100 mg Oral Daily   Or   thiamine  100 mg Intravenous Daily   [START ON 03/08/2021] vitamin B-12  1,000 mcg Oral Daily   Continuous:  sodium chloride 125 mL/hr at 03/07/21 1012   pantoprazole     pantoprazole 8 mg/hr (03/07/21 1012)    Results for orders placed or performed during the hospital encounter of 03/07/21 (from the past 24 hour(s))  CBC with Differential     Status: Abnormal   Collection Time: 03/07/21 12:19 AM  Result Value Ref Range   WBC 11.5 (H) 4.0 - 10.5 K/uL   RBC 3.28 (L) 3.87 - 5.11 MIL/uL   Hemoglobin 11.6 (L) 12.0 - 15.0 g/dL   HCT 96.2 (L) 22.9 - 79.8 %   MCV 109.5 (H) 80.0 - 100.0 fL   MCH 35.4 (H) 26.0 - 34.0 pg   MCHC 32.3 30.0 - 36.0 g/dL   RDW 92.1 19.4 - 17.4 %   Platelets 227 150 - 400 K/uL   nRBC 0.0 0.0 - 0.2 %   Neutrophils Relative % 57 %   Neutro Abs 6.3 1.7 - 7.7 K/uL   Lymphocytes Relative 38 %   Lymphs Abs  4.4 (H) 0.7 - 4.0 K/uL   Monocytes Relative 5 %   Monocytes Absolute 0.6 0.1 - 1.0 K/uL   Eosinophils Relative 0 %   Eosinophils Absolute 0.0 0.0 - 0.5 K/uL   Basophils Relative 0 %   Basophils Absolute 0.0 0.0 - 0.1 K/uL   Immature Granulocytes 0 %   Abs Immature Granulocytes 0.04 0.00 - 0.07 K/uL  Comprehensive metabolic panel     Status: Abnormal   Collection Time: 03/07/21 12:19 AM  Result Value Ref Range   Sodium 136 135 - 145 mmol/L   Potassium 3.8 3.5 - 5.1 mmol/L   Chloride 99 98 - 111 mmol/L   CO2 17 (L) 22 - 32 mmol/L   Glucose, Bld 279 (H) 70 - 99 mg/dL   BUN 34 (H) 6 - 20 mg/dL   Creatinine, Ser 1.611.08 (H) 0.44 - 1.00 mg/dL   Calcium 9.5 8.9 - 09.610.3 mg/dL   Total Protein 6.1 (L) 6.5 - 8.1 g/dL   Albumin 3.4 (L) 3.5 - 5.0 g/dL   AST 49 (H) 15 - 41 U/L   ALT 58 (H) 0 - 44 U/L   Alkaline Phosphatase 73 38 - 126 U/L   Total Bilirubin 1.5 (H) 0.3 - 1.2 mg/dL   GFR, Estimated >04>60 >54>60 mL/min   Anion gap  20 (H) 5 - 15  Lipase, blood     Status: None   Collection Time: 03/07/21 12:19 AM  Result Value Ref Range   Lipase 40 11 - 51 U/L  Lactic acid, plasma     Status: Abnormal   Collection Time: 03/07/21 12:19 AM  Result Value Ref Range   Lactic Acid, Venous 6.5 (HH) 0.5 - 1.9 mmol/L  Protime-INR     Status: None   Collection Time: 03/07/21 12:19 AM  Result Value Ref Range   Prothrombin Time 13.7 11.4 - 15.2 seconds   INR 1.1 0.8 - 1.2  Type and screen [facility]     Status: None   Collection Time: 03/07/21 12:30 AM  Result Value Ref Range   ABO/RH(D) A POS    Antibody Screen NEG    Sample Expiration      03/10/2021,2359 Performed at [facility], [address] [place], [place] [address]   POC occult blood, ED     Status: Abnormal   Collection Time: 03/07/21 12:56 AM  Result Value Ref Range   Fecal Occult Bld POSITIVE (A) NEGATIVE  Resp Panel by RT-PCR (Flu A&B, Covid) Nasopharyngeal Swab     Status: None   Collection Time: 03/07/21  1:30 AM   Specimen: Nasopharyngeal Swab; Nasopharyngeal(NP) swabs in vial transport medium  Result Value Ref Range   SARS Coronavirus 2 by RT PCR NEGATIVE NEGATIVE   Influenza A by PCR NEGATIVE NEGATIVE   Influenza B by PCR NEGATIVE NEGATIVE  I-Stat Beta hCG blood, ED ([facility], [facility], [facility] only)     Status: None   Collection Time: 03/07/21  1:45 AM  Result Value Ref Range   I-stat hCG, quantitative <5.0 <5 mIU/mL   Comment 3          I-stat chem 8, ED (not at [facility] or [facility])     Status: Abnormal   Collection Time: 03/07/21  1:47 AM  Result Value Ref Range   Sodium 138 135 - 145 mmol/L   Potassium 4.3 3.5 - 5.1 mmol/L   Chloride 105 98 - 111 mmol/L   BUN 32 (H) 6 - 20 mg/dL  Creatinine, Ser 0.70 0.44 - 1.00 mg/dL   Glucose, Bld 440 (H) 70 - 99 mg/dL   Calcium, Ion 1.02 7.25 - 1.40 mmol/L   TCO2 23 22 - 32 mmol/L   Hemoglobin 9.9 (L) 12.0 - 15.0 g/dL   HCT 36.6 (L) 44.0 - 34.7 %  CBC     Status: Abnormal   Collection Time:  03/07/21  4:00 AM  Result Value Ref Range   WBC 6.9 4.0 - 10.5 K/uL   RBC 2.72 (L) 3.87 - 5.11 MIL/uL   Hemoglobin 9.5 (L) 12.0 - 15.0 g/dL   HCT 42.5 (L) 95.6 - 38.7 %   MCV 104.4 (H) 80.0 - 100.0 fL   MCH 34.9 (H) 26.0 - 34.0 pg   MCHC 33.5 30.0 - 36.0 g/dL   RDW 56.4 33.2 - 95.1 %   Platelets 168 150 - 400 K/uL   nRBC 0.0 0.0 - 0.2 %  Protime-INR     Status: None   Collection Time: 03/07/21  4:00 AM  Result Value Ref Range   Prothrombin Time 14.3 11.4 - 15.2 seconds   INR 1.1 0.8 - 1.2  Lactic acid, plasma     Status: None   Collection Time: 03/07/21  4:00 AM  Result Value Ref Range   Lactic Acid, Venous 1.3 0.5 - 1.9 mmol/L  Vitamin B12     Status: Abnormal   Collection Time: 03/07/21  4:00 AM  Result Value Ref Range   Vitamin B-12 98 (L) 180 - 914 pg/mL  Iron and TIBC     Status: Abnormal   Collection Time: 03/07/21  4:00 AM  Result Value Ref Range   Iron 205 (H) 28 - 170 ug/dL   TIBC 884 (L) 166 - 063 ug/dL   Saturation Ratios 88 (H) 10.4 - 31.8 %   UIBC 27 ug/dL  Ferritin     Status: Abnormal   Collection Time: 03/07/21  4:00 AM  Result Value Ref Range   Ferritin 309 (H) 11 - 307 ng/mL  ABO/Rh     Status: None   Collection Time: 03/07/21  4:04 AM  Result Value Ref Range   ABO/RH(D)      A POS Performed at [facility], [address] [place], [place] [address]   Hemoglobin A1c     Status: None   Collection Time: 03/07/21  4:04 AM  Result Value Ref Range   Hgb A1c MFr Bld 4.9 4.8 - 5.6 %   Mean Plasma Glucose 93.93 mg/dL  Reticulocytes     Status: Abnormal   Collection Time: 03/07/21  4:25 AM  Result Value Ref Range   Retic Ct Pct 2.4 0.4 - 3.1 %   RBC. 2.69 (L) 3.87 - 5.11 MIL/uL   Retic Count, Absolute 64.6 19.0 - 186.0 K/uL   Immature Retic Fract 12.0 2.3 - 15.9 %  Folate     Status: None   Collection Time: 03/07/21  4:30 AM  Result Value Ref Range   Folate 8.2 >5.9 ng/mL  CBC     Status: Abnormal   Collection Time: 03/07/21  8:06 AM   Result Value Ref Range   WBC 6.6 4.0 - 10.5 K/uL   RBC 2.67 (L) 3.87 - 5.11 MIL/uL   Hemoglobin 9.4 (L) 12.0 - 15.0 g/dL   HCT 09.3 (L) 23.5 - 57.3 %   MCV 104.9 (H) 80.0 - 100.0 fL   MCH 35.2 (H) 26.0 - 34.0 pg   MCHC 33.6 30.0 -  36.0 g/dL   RDW 94.8 54.6 - 27.0 %   Platelets 158 150 - 400 K/uL   nRBC 0.0 0.0 - 0.2 %  Lactic acid, plasma     Status: None   Collection Time: 03/07/21  8:06 AM  Result Value Ref Range   Lactic Acid, Venous 0.9 0.5 - 1.9 mmol/L  CBG monitoring, ED     Status: Abnormal   Collection Time: 03/07/21  8:37 AM  Result Value Ref Range   Glucose-Capillary 108 (H) 70 - 99 mg/dL   Comment 1 Notify RN    Comment 2 Document in Chart      CT ABDOMEN PELVIS W CONTRAST  Result Date: 03/07/2021 CLINICAL DATA:  Abdominal pain, acute, nonlocalized.  Vomiting. EXAM: CT ABDOMEN AND PELVIS WITH CONTRAST TECHNIQUE: Multidetector CT imaging of the abdomen and pelvis was performed using the standard protocol following bolus administration of intravenous contrast. RADIATION DOSE REDUCTION: This exam was performed according to the departmental dose-optimization program which includes automated exposure control, adjustment of the mA and/or kV according to patient size and/or use of iterative reconstruction technique. CONTRAST:  OMNIPAQUE IOHEXOL 300 MG/ML  SOLN COMPARISON:  04/27/2013 FINDINGS: Lower chest: Fall moderate-sized hiatal hernia. No acute abnormality. Hepatobiliary: Diffuse low-density throughout the liver compatible with fatty infiltration. No focal abnormality. Gallbladder unremarkable. Pancreas: No focal abnormality or ductal dilatation. Spleen: No focal abnormality.  Normal size. Adrenals/Urinary Tract: No adrenal abnormality. No focal renal abnormality. No stones or hydronephrosis. Urinary bladder is unremarkable. Stomach/Bowel: Stomach, large and small bowel grossly unremarkable. Normal appendix. Vascular/Lymphatic: Aortic atherosclerosis. No evidence of aneurysm  or adenopathy. Reproductive: Uterus and adnexa unremarkable.  No mass. Other: No free fluid or free air. Musculoskeletal: No acute bony abnormality. IMPRESSION: No acute findings in the abdomen or pelvis. Hepatic steatosis. Moderate-sized hiatal hernia. Aortic atherosclerosis. Electronically Signed   By: Charlett Nose M.D.   On: 03/07/2021 02:02    ROS:  As stated above in the HPI otherwise negative.  Blood pressure 108/75, pulse 83, temperature 97.9 F (36.6 C), resp. rate 17, height 5\' 6"  (1.676 m), weight 90.7 kg, SpO2 97 %.    PE: Gen: NAD, Alert and Oriented, spider angiomas on the chest - mild HEENT:  [place]/AT, EOMI Neck: Supple, no LAD Lungs: CTA Bilaterally CV: RRR without M/G/R ABD: Soft, NTND, +BS Ext: No C/C/E, mild palmar erythema  Assessment/Plan: 1) Melena. 2) Anemia. 3) Macrocytosis. 4) Elevated liver enzymes. 5) Hepatic steatosis   The patient is stable and she is on pantoprazole.  An EGD will be performed tomorrow.  Review of her blood work suggests changes consistent with chronic ETOH use, ie, macrocytosis.  Her physical examination also shows spider angiomas on her chest and some mild palmar erythema.  There was no overt evidence of cirrhosis or portal HTN on the CT scan.  She was counseled about stopping ETOH.  Plan: 1) EGD tomorrow. 2) Follow liver enzymes and transfuse if necessary. 3) ETOH cessation.  Corryn Madewell D 03/07/2021, 11:38 AM

## 2021-03-07 NOTE — ED Notes (Signed)
Patient transported to CT 

## 2021-03-07 NOTE — Progress Notes (Signed)
?PROGRESS NOTE ? ?Jeanette Webb  ?DOB: March 22, 1965  ?PCP: Patient, No Pcp Per (Inactive) ?MGQ:[id number]  ?DOA: 03/07/2021 ? LOS: 0 days  ?Hospital Day: 1 ? ?Brief narrative: ?Jeanette Webb is a 56 y.o. female with PMH significant for anxiety/depression, chronic alcohol use, 1 pack/day smoking. ?Patient presented to the ED early around midnight last night with complaint of 2 episodes of black tarry stool and 2 episodes of hematemesis.   ?While waiting in the ER, patient had a near syncopal episode. ?Patient administered drinking 2-3 drinks of wine and beer, ' not every day'.  Last week, she also took 2 doses of ibuprofen, denies taking it regularly. ?No history of GI bleeding, endoscopy in the past. ? ?In the ED, patient was afebrile, heart rate in 90s, blood pressure was initially low in 60s which later improved to low 100s. ?Labs with hemoglobin initially at 11.6 but on repeat was low at 9.9.  Initial lactic acid level was low at 6.5, creatinine slightly elevated at 1.08, glucose level elevated to 279, AST/ALT/alk phos at 49/58/73, total bilirubin 1.5 ?Fecal occult blood positive. ?CT abdomen pelvis did not show any acute intra-abdominal pathology. ?Her blood pressure and lactic acid level improved with IV fluid.  Patient was started on Protonix drip. ?Admitted to hospitalist service. ?GI has been consulted ? ?Subjective: ?Patient was seen and examined this morning. ?Pleasant middle-aged Caucasian female.  Propped up in bed.  Not in distress.  No new symptoms.  Denies any withdrawal symptoms in the past. ?Remains n.p.o. at this time pending GI plan ? ?Principal Problem: ?  Acute GI bleeding ?Active Problems: ?  Acute blood loss anemia ?  ?Assessment and Plan: ?Acute GI bleeding, suspect upper GI ?Moderate-sized hiatal hernia ?-Presented with melanotic stool, hematemesis in the setting of chronic alcohol use, chronic smoking ?-FOBT positive. ?-CT abdomen showed moderate-sized hiatal hernia ?-Started on Protonix drip in  the ED.  GI consulted. ?-Currently remains n.p.o. ? ?Acute on chronic macrocytic anemia ?Vitamin B12 deficiency ?-Patient seems to have chronic vitamin B12 deficiency probably related to alcoholism.  B12 level low at 98.  IM and oral supplements started ?-Acute drop in hemoglobin due to acute GI bleeding ?-Last hemoglobin 9.4 this morning.  Continue to monitor.  Transfuse if less than 7. ?Recent Labs  ?  03/07/21 ?0019 03/07/21 ?0147 03/07/21 ?0400 03/07/21 ?0425 03/07/21 ?0430 03/07/21 ?2671  ?HGB 11.6* 9.9* 9.5*  --   --  9.4*  ?MCV 109.5*  --  104.4*  --   --  104.9*  ?VITAMINB12  --   --  98*  --   --   --   ?FOLATE  --   --   --   --  8.2  --   ?FERRITIN  --   --  309*  --   --   --   ?TIBC  --   --  232*  --   --   --   ?IRON  --   --  205*  --   --   --   ?RETICCTPCT  --   --   --  2.4  --   --   ? ?Near syncopal episode ?Lactic acidosis ?-While waiting in the ED, patient had a near syncopal episode.  Her blood pressure was low in 60s.  Lactic acid level drawn immediately was elevated to 6.5 probably because of impaired tissue perfusion. ?-Blood pressure and lactic acid level improved with hydration. ?-No evidence of infection/sepsis. ?Recent Labs  ?Lab 03/07/21 ?  0019 03/07/21 ?0400 03/07/21 ?0806  ?WBC 11.5* 6.9 6.6  ?LATICACIDVEN 6.5* 1.3 0.9  ? ? ?Hyperglycemia ?-No history of diabetes mellitus.  Blood sugar level initially was over 200.  A1c at 4.9 on 03/07/2021 ? ?Chronic alcoholism ?Hepatic steatosis ?Elevated liver enzymes ?-Patient states she drinks 2-3 drinks of wine and beer but not every day.  Questionable reliability.   ?-Monitor for withdrawal symptoms. ?-CT abdomen showed fatty liver.  Likely because of elevated AST and ALT.  Normal alk phos.  Pending acute hepatitis panel ?Recent Labs  ?Lab 03/07/21 ?0019  ?AST 49*  ?ALT 58*  ?ALKPHOS 73  ?BILITOT 1.5*  ?PROT 6.1*  ?ALBUMIN 3.4*  ?  ? ?Chronic smoking ?-Patient states he smokes about 1 pack/day. ?-Nicotine patch  offered ? ?Anxiety/depression ?-Does not seem to be on any meds.  If she could be on medicines but then, it may help with substance abuse. ?  ? ?Goals of care ?  Code Status: Full Code  ? ? ?Mobility: Encourage ambulation ? ?Nutritional status:  ?Body mass index is 32.28 kg/m?.  ?  ?  ? ? ? ? ?Diet:  ?Diet Order   ? ?       ?  Diet NPO time specified  Diet effective now       ?  ? ?  ?  ? ?  ? ? ?DVT prophylaxis:  ?SCDs Start: 03/07/21 0342 ?  ?Antimicrobials: None ?Fluid: Currently on normal saline at 125 mill per hour ?Consultants: GI ?Family Communication: None at bedside ? ?Status is: Observation ? ?Continue in-hospital care because: Pending GI plan ?Level of care: Progressive  ? ?Dispo: The patient is from: Home ?             Anticipated d/c is to: Hopefully home ?             Patient currently is not medically stable to d/c. ?  Difficult to place patient No ? ? ? ? ?Infusions:  ? sodium chloride 125 mL/hr at 03/07/21 0209  ? pantoprazole    ? pantoprazole 8 mg/hr (03/07/21 0207)  ? ? ?Scheduled Meds: ? cyanocobalamin  1,000 mcg Intramuscular Once  ? folic acid  1 mg Oral Daily  ? multivitamin with minerals  1 tablet Oral Daily  ? [START ON 03/10/2021] pantoprazole  40 mg Intravenous Q12H  ? thiamine  100 mg Oral Daily  ? Or  ? thiamine  100 mg Intravenous Daily  ? [START ON 03/08/2021] vitamin B-12  1,000 mcg Oral Daily  ? ? ?PRN meds: ?  ? ?Antimicrobials: ?Anti-infectives (From admission, onward)  ? ? None  ? ?  ? ? ?Objective: ?Vitals:  ? 03/07/21 0857 03/07/21 0915  ?BP:  103/75  ?Pulse: 87 89  ?Resp:  18  ?Temp:    ?SpO2:  99%  ? ? ?Intake/Output Summary (Last 24 hours) at 03/07/2021 0954 ?Last data filed at 03/07/2021 0300 ?Gross per 24 hour  ?Intake 2000 ml  ?Output --  ?Net 2000 ml  ? ?Filed Weights  ? 03/07/21 0021  ?Weight: 90.7 kg  ? ?Weight change:  ?Body mass index is 32.28 kg/m?.  ? ?Physical Exam: ?General exam: Pleasant, middle-aged Caucasian female.  Not in physical distress ?Skin: No rashes, lesions  or ulcers. ?HEENT: Atraumatic, normocephalic, no obvious bleeding ?Lungs: Clear to auscultation bilaterally ?CVS: Regular rate and rhythm, no murmur ?GI/Abd soft, nontender, nondistended, bowel sound present ?CNS: Alert, awake, oriented x3 ?Psychiatry: Mood appropriate ?Extremities: No pedal edema, no  calf tenderness ? ?Data Review: I have personally reviewed the laboratory data and studies available. ? ?F/u labs ordered ?Unresulted Labs (From admission, onward)  ? ?  Start     Ordered  ? 03/08/21 0500  CBC with Differential/Platelet  Daily,   R     ? 03/07/21 0821  ? 03/08/21 0500  Comprehensive metabolic panel  Daily,   R     ? 03/07/21 0821  ? 03/07/21 0405  Hepatitis panel, acute  Once,   R       ? 03/07/21 0404  ? 03/07/21 0342  HIV Antibody (routine testing w rflx)  (HIV Antibody (Routine testing w reflex) panel)  Once,   R       ? 03/07/21 0343  ? ?  ?  ? ?  ? ? ?Signed, ?Terrilee Croak, MD ?[facility] ?03/07/2021 ? ? ? ? ? ? ? ? ? ? ?  ?

## 2021-03-07 NOTE — ED Provider Notes (Signed)
[facility] EMERGENCY DEPARTMENT Provider Note   CSN: [id number] Arrival date & time: 03/07/21  0002     History  Chief Complaint  Patient presents with   Vomiting    Jeanette Webb is a 56 y.o. female.  The history is provided by the patient.  Rectal Bleeding Quality:  Black and tarry Amount:  Moderate Duration:  1 day Timing:  Intermittent Chronicity:  New Context: defecation   Relieved by:  Nothing Worsened by:  Nothing Ineffective treatments:  None tried Associated symptoms: hematemesis and vomiting   Associated symptoms: no abdominal pain and no fever   Associated symptoms comment:  Coffee ground emesis started this evening.   Risk factors: no anticoagulant use, no hx of IBD and no NSAID use   Patient with no significant past medical history presents with one day of melena and then hematemesis this evening.  Patient has not ever had an ulcer, does not use NSAIDs, and has not ever had a colonoscopy or endoscopy.      Home Medications Prior to Admission medications   Medication Sig Start Date End Date Taking? Authorizing Provider  Carboxymethylcellulose Sodium (DRY EYE RELIEF OP) Place 2-3 drops into both eyes 2 (two) times daily as needed (dry eyes).   Yes [provider]  ibuprofen (ADVIL) 200 MG tablet Take 400 mg by mouth every 6 (six) hours as needed for moderate pain or headache.   Yes [provider]      Allergies    Patient has no known allergies.    Review of Systems   Review of Systems  Constitutional:  Negative for fever.  HENT:  Negative for facial swelling.   Eyes:  Negative for redness.  Respiratory:  Negative for wheezing and stridor.   Cardiovascular:  Negative for leg swelling.  Gastrointestinal:  Positive for anal bleeding, hematemesis, hematochezia and vomiting. Negative for abdominal pain.  Musculoskeletal:  Negative for neck stiffness.  Skin:  Negative for rash.  Neurological:  Negative for facial  asymmetry.  Psychiatric/Behavioral:  Negative for agitation.   All other systems reviewed and are negative.  Physical Exam Updated Vital Signs BP 106/70    Pulse 86    Temp 97.9 F (36.6 C)    Resp (!) 27    Ht 5\' 6"  (1.676 m)    Wt 90.7 kg    SpO2 100%    BMI 32.28 kg/m  Physical Exam Vitals and nursing note reviewed. Exam conducted with a chaperone present.  Constitutional:      Appearance: Normal appearance. She is not diaphoretic.  HENT:     Head: Normocephalic and atraumatic.     Nose: Nose normal.  Eyes:     Conjunctiva/sclera: Conjunctivae normal.     Pupils: Pupils are equal, round, and reactive to light.  Cardiovascular:     Rate and Rhythm: Normal rate and regular rhythm.     Pulses: Normal pulses.     Heart sounds: Normal heart sounds.  Pulmonary:     Effort: Pulmonary effort is normal.     Breath sounds: Normal breath sounds.  Abdominal:     General: Abdomen is flat. Bowel sounds are normal.     Palpations: Abdomen is soft.     Tenderness: There is no abdominal tenderness. There is no guarding or rebound.  Genitourinary:    Rectum: Guaiac result positive.  Musculoskeletal:        General: Normal range of motion.     Cervical back: Normal range  of motion and neck supple.  Skin:    General: Skin is warm and dry.     Capillary Refill: Capillary refill takes less than 2 seconds.  Neurological:     General: No focal deficit present.     Mental Status: She is alert and oriented to person, place, and time.     Deep Tendon Reflexes: Reflexes normal.  Psychiatric:        Mood and Affect: Mood normal.        Behavior: Behavior normal.    ED Results / Procedures / Treatments   Labs (all labs ordered are listed, but only abnormal results are displayed) Results for orders placed or performed during the hospital encounter of 03/07/21  CBC with Differential  Result Value Ref Range   WBC 11.5 (H) 4.0 - 10.5 K/uL   RBC 3.28 (L) 3.87 - 5.11 MIL/uL   Hemoglobin 11.6  (L) 12.0 - 15.0 g/dL   HCT 35.9 (L) 36.0 - 46.0 %   MCV 109.5 (H) 80.0 - 100.0 fL   MCH 35.4 (H) 26.0 - 34.0 pg   MCHC 32.3 30.0 - 36.0 g/dL   RDW 11.9 11.5 - 15.5 %   Platelets 227 150 - 400 K/uL   nRBC 0.0 0.0 - 0.2 %   Neutrophils Relative % 57 %   Neutro Abs 6.3 1.7 - 7.7 K/uL   Lymphocytes Relative 38 %   Lymphs Abs 4.4 (H) 0.7 - 4.0 K/uL   Monocytes Relative 5 %   Monocytes Absolute 0.6 0.1 - 1.0 K/uL   Eosinophils Relative 0 %   Eosinophils Absolute 0.0 0.0 - 0.5 K/uL   Basophils Relative 0 %   Basophils Absolute 0.0 0.0 - 0.1 K/uL   Immature Granulocytes 0 %   Abs Immature Granulocytes 0.04 0.00 - 0.07 K/uL  Comprehensive metabolic panel  Result Value Ref Range   Sodium 136 135 - 145 mmol/L   Potassium 3.8 3.5 - 5.1 mmol/L   Chloride 99 98 - 111 mmol/L   CO2 17 (L) 22 - 32 mmol/L   Glucose, Bld 279 (H) 70 - 99 mg/dL   BUN 34 (H) 6 - 20 mg/dL   Creatinine, Ser 1.08 (H) 0.44 - 1.00 mg/dL   Calcium 9.5 8.9 - 10.3 mg/dL   Total Protein 6.1 (L) 6.5 - 8.1 g/dL   Albumin 3.4 (L) 3.5 - 5.0 g/dL   AST 49 (H) 15 - 41 U/L   ALT 58 (H) 0 - 44 U/L   Alkaline Phosphatase 73 38 - 126 U/L   Total Bilirubin 1.5 (H) 0.3 - 1.2 mg/dL   GFR, Estimated >60 >60 mL/min   Anion gap 20 (H) 5 - 15  Lipase, blood  Result Value Ref Range   Lipase 40 11 - 51 U/L  Lactic acid, plasma  Result Value Ref Range   Lactic Acid, Venous 6.5 (HH) 0.5 - 1.9 mmol/L  Protime-INR  Result Value Ref Range   Prothrombin Time 13.7 11.4 - 15.2 seconds   INR 1.1 0.8 - 1.2  I-stat chem 8, ED (not at [facility] or [facility])  Result Value Ref Range   Sodium 138 135 - 145 mmol/L   Potassium 4.3 3.5 - 5.1 mmol/L   Chloride 105 98 - 111 mmol/L   BUN 32 (H) 6 - 20 mg/dL   Creatinine, Ser 0.70 0.44 - 1.00 mg/dL   Glucose, Bld 216 (H) 70 - 99 mg/dL   Calcium, Ion 1.21 1.15 - 1.40 mmol/L  TCO2 23 22 - 32 mmol/L   Hemoglobin 9.9 (L) 12.0 - 15.0 g/dL   HCT 29.0 (L) 36.0 - 46.0 %  POC occult blood, ED  Result Value Ref  Range   Fecal Occult Bld POSITIVE (A) NEGATIVE  I-Stat Beta hCG blood, ED ([facility], [facility], [facility] only)  Result Value Ref Range   I-stat hCG, quantitative <5.0 <5 mIU/mL   Comment 3          Type and screen [facility]  Result Value Ref Range   ABO/RH(D) A POS    Antibody Screen NEG    Sample Expiration      03/10/2021,2359 Performed at [facility] [address], [place], [place] [address]    CT ABDOMEN PELVIS W CONTRAST  Result Date: 03/07/2021 CLINICAL DATA:  Abdominal pain, acute, nonlocalized.  Vomiting. EXAM: CT ABDOMEN AND PELVIS WITH CONTRAST TECHNIQUE: Multidetector CT imaging of the abdomen and pelvis was performed using the standard protocol following bolus administration of intravenous contrast. RADIATION DOSE REDUCTION: This exam was performed according to the departmental dose-optimization program which includes automated exposure control, adjustment of the mA and/or kV according to patient size and/or use of iterative reconstruction technique. CONTRAST:  173mL OMNIPAQUE IOHEXOL 300 MG/ML  SOLN COMPARISON:  04/27/2013 FINDINGS: Lower chest: Fall moderate-sized hiatal hernia. No acute abnormality. Hepatobiliary: Diffuse low-density throughout the liver compatible with fatty infiltration. No focal abnormality. Gallbladder unremarkable. Pancreas: No focal abnormality or ductal dilatation. Spleen: No focal abnormality.  Normal size. Adrenals/Urinary Tract: No adrenal abnormality. No focal renal abnormality. No stones or hydronephrosis. Urinary bladder is unremarkable. Stomach/Bowel: Stomach, large and small bowel grossly unremarkable. Normal appendix. Vascular/Lymphatic: Aortic atherosclerosis. No evidence of aneurysm or adenopathy. Reproductive: Uterus and adnexa unremarkable.  No mass. Other: No free fluid or free air. Musculoskeletal: No acute bony abnormality. IMPRESSION: No acute findings in the abdomen or pelvis. Hepatic steatosis. Moderate-sized hiatal hernia. Aortic  atherosclerosis. Electronically Signed   By: Rolm Baptise M.D.   On: 03/07/2021 02:02    EKG None  Radiology CT ABDOMEN PELVIS W CONTRAST  Result Date: 03/07/2021 CLINICAL DATA:  Abdominal pain, acute, nonlocalized.  Vomiting. EXAM: CT ABDOMEN AND PELVIS WITH CONTRAST TECHNIQUE: Multidetector CT imaging of the abdomen and pelvis was performed using the standard protocol following bolus administration of intravenous contrast. RADIATION DOSE REDUCTION: This exam was performed according to the departmental dose-optimization program which includes automated exposure control, adjustment of the mA and/or kV according to patient size and/or use of iterative reconstruction technique. CONTRAST:  119mL OMNIPAQUE IOHEXOL 300 MG/ML  SOLN COMPARISON:  04/27/2013 FINDINGS: Lower chest: Fall moderate-sized hiatal hernia. No acute abnormality. Hepatobiliary: Diffuse low-density throughout the liver compatible with fatty infiltration. No focal abnormality. Gallbladder unremarkable. Pancreas: No focal abnormality or ductal dilatation. Spleen: No focal abnormality.  Normal size. Adrenals/Urinary Tract: No adrenal abnormality. No focal renal abnormality. No stones or hydronephrosis. Urinary bladder is unremarkable. Stomach/Bowel: Stomach, large and small bowel grossly unremarkable. Normal appendix. Vascular/Lymphatic: Aortic atherosclerosis. No evidence of aneurysm or adenopathy. Reproductive: Uterus and adnexa unremarkable.  No mass. Other: No free fluid or free air. Musculoskeletal: No acute bony abnormality. IMPRESSION: No acute findings in the abdomen or pelvis. Hepatic steatosis. Moderate-sized hiatal hernia. Aortic atherosclerosis. Electronically Signed   By: Rolm Baptise M.D.   On: 03/07/2021 02:02    Procedures Procedures    Medications Ordered in ED Medications  pantoprazole (PROTONIX) 80 mg /NS 100 mL IVPB (80 mg Intravenous Not Given 03/07/21 0119)  pantoprozole (PROTONIX) 80 mg /NS 100 mL infusion (8 mg/hr  Intravenous New Bag/Given 03/07/21 0207)  pantoprazole (PROTONIX) injection 40 mg (has no administration in time range)  0.9 %  sodium chloride infusion ( Intravenous New Bag/Given 03/07/21 0209)  sodium chloride 0.9 % bolus 1,000 mL (0 mLs Intravenous Stopped 03/07/21 0130)  pantoprazole (PROTONIX) injection 80 mg (80 mg Intravenous Given 03/07/21 0030)  ondansetron (ZOFRAN) injection 4 mg (4 mg Intravenous Given 03/07/21 0030)  sodium chloride 0.9 % bolus 1,000 mL (1,000 mLs Intravenous New Bag/Given 03/07/21 0133)  iohexol (OMNIPAQUE) 300 MG/ML solution 100 mL (100 mLs Intravenous Contrast Given 03/07/21 0147)    ED Course/ Medical Decision Making/ A&P                           Medical Decision Making Melena and then hematemesis.  No ASA, no NSAIDs no anticoagulation   Problems Addressed: Gastrointestinal hemorrhage, unspecified gastrointestinal hemorrhage type: acute illness or injury    Details: IVF initiated, PPI intiated GI consulted will admit  Amount and/or Complexity of Data Reviewed Labs: ordered.    Details: All labs reviewed by me:  anemia with hemoglobin of 9.9, elevated BUN but normal creatinine, elevated AST, ALT and bilirubin, elevated glucose at 279, fecal occult blood is positive. Radiology: ordered.    Details: CT reviewed by me without acute finding to explain patient's symptoms Discussion of management or test interpretation with external provider(s): Secure chat to Dr. Collene Mares of GI Case d/w Dr. Hal Hope who will admit the patient   Risk Prescription drug management. Decision regarding hospitalization. Risk Details: Patient will need inpatient hospitalization given melena and Hematemesis.  Will keep NPO for procedure.  Pressures now normal with IVF   Critical Care Total time providing critical care: 30-74 minutes  CRITICAL CARE Performed by: Elber Galyean K Elleah Hemsley-Rasch Total critical care time: 60 minutes Critical care time was exclusive of separately billable procedures and  treating other patients. Critical care was necessary to treat or prevent imminent or life-threatening deterioration. Critical care was time spent personally by me on the following activities: development of treatment plan with patient and/or surrogate as well as nursing, discussions with consultants, evaluation of patient's response to treatment, examination of patient, obtaining history from patient or surrogate, ordering and performing treatments and interventions, ordering and review of laboratory studies, ordering and review of radiographic studies, pulse oximetry and re-evaluation of patient's condition.  Final Clinical Impression(s) / ED Diagnoses Final diagnoses:  Gastrointestinal hemorrhage, unspecified gastrointestinal hemorrhage type    Rx / DC Orders ED Discharge Orders     None         Quanesha Klimaszewski, MD 03/07/21 [id number]

## 2021-03-07 NOTE — ED Notes (Signed)
Lab called with critical lab value: lactic 6.5. MD notified.  ?

## 2021-03-07 NOTE — ED Notes (Signed)
Pt ambulatory to bathroom

## 2021-03-07 NOTE — ED Notes (Signed)
Pt ambulatory to bathroom and back to room without difficulty. Pt denies any complaints. Pt a/ox4, denies dizziness. No acute changes noted. Will continue to monitor.  ?

## 2021-03-07 NOTE — ED Triage Notes (Signed)
Reports coffee ground emesis and dark tarry stools beginning earlier today. Near syncopal episode in waiting area prior to triage.  ?

## 2021-03-08 ENCOUNTER — Observation Stay (HOSPITAL_COMMUNITY): Payer: Self-pay | Admitting: Certified Registered Nurse Anesthetist

## 2021-03-08 ENCOUNTER — Observation Stay (HOSPITAL_BASED_OUTPATIENT_CLINIC_OR_DEPARTMENT_OTHER): Payer: Self-pay | Admitting: Certified Registered Nurse Anesthetist

## 2021-03-08 ENCOUNTER — Encounter (HOSPITAL_COMMUNITY)
Admission: EM | Disposition: A | Discharge status: Home / Self Care | Payer: Self-pay | Source: Home / Self Care | Attending: Emergency Medicine

## 2021-03-08 ENCOUNTER — Encounter (HOSPITAL_COMMUNITY): Payer: Self-pay | Admitting: Internal Medicine

## 2021-03-08 HISTORY — PX: BIOPSY: SHX5522

## 2021-03-08 HISTORY — PX: ESOPHAGOGASTRODUODENOSCOPY (EGD) WITH PROPOFOL: SHX5813

## 2021-03-08 LAB — CBC WITH DIFFERENTIAL/PLATELET
Abs Immature Granulocytes: 0.02 10*3/uL (ref 0.00–0.07)
Basophils Absolute: 0 10*3/uL (ref 0.0–0.1)
Basophils Relative: 1 %
Eosinophils Absolute: 0.1 10*3/uL (ref 0.0–0.5)
Eosinophils Relative: 1 %
HCT: 24.5 % — ABNORMAL LOW (ref 36.0–46.0)
Hemoglobin: 8.3 g/dL — ABNORMAL LOW (ref 12.0–15.0)
Immature Granulocytes: 0 %
Lymphocytes Relative: 57 %
Lymphs Abs: 3.2 10*3/uL (ref 0.7–4.0)
MCH: 35.3 pg — ABNORMAL HIGH (ref 26.0–34.0)
MCHC: 33.9 g/dL (ref 30.0–36.0)
MCV: 104.3 fL — ABNORMAL HIGH (ref 80.0–100.0)
Monocytes Absolute: 0.4 10*3/uL (ref 0.1–1.0)
Monocytes Relative: 7 %
Neutro Abs: 2 10*3/uL (ref 1.7–7.7)
Neutrophils Relative %: 34 %
Platelets: 144 10*3/uL — ABNORMAL LOW (ref 150–400)
RBC: 2.35 MIL/uL — ABNORMAL LOW (ref 3.87–5.11)
RDW: 12 % (ref 11.5–15.5)
WBC: 5.8 10*3/uL (ref 4.0–10.5)
nRBC: 0 % (ref 0.0–0.2)

## 2021-03-08 LAB — COMPREHENSIVE METABOLIC PANEL
ALT: 33 U/L (ref 0–44)
AST: 26 U/L (ref 15–41)
Albumin: 2.8 g/dL — ABNORMAL LOW (ref 3.5–5.0)
Alkaline Phosphatase: 53 U/L (ref 38–126)
Anion gap: 4 — ABNORMAL LOW (ref 5–15)
BUN: 15 mg/dL (ref 6–20)
CO2: 25 mmol/L (ref 22–32)
Calcium: 8 mg/dL — ABNORMAL LOW (ref 8.9–10.3)
Chloride: 108 mmol/L (ref 98–111)
Creatinine, Ser: 0.78 mg/dL (ref 0.44–1.00)
GFR, Estimated: >60 mL/min (ref >60)
Glucose, Bld: 112 mg/dL — ABNORMAL HIGH (ref 70–99)
Potassium: 3.7 mmol/L (ref 3.5–5.1)
Sodium: 137 mmol/L (ref 135–145)
Total Bilirubin: 0.6 mg/dL (ref 0.3–1.2)
Total Protein: 4.7 g/dL — ABNORMAL LOW (ref 6.5–8.1)

## 2021-03-08 LAB — GLUCOSE, CAPILLARY
Glucose-Capillary: 131 mg/dL — ABNORMAL HIGH (ref 70–99)
Glucose-Capillary: 97 mg/dL (ref 70–99)

## 2021-03-08 SURGERY — ESOPHAGOGASTRODUODENOSCOPY (EGD) WITH PROPOFOL
Anesthesia: Monitor Anesthesia Care

## 2021-03-08 MED ORDER — CYANOCOBALAMIN 1000 MCG PO TABS: 1000.0000 ug | ORAL_TABLET | Freq: Every day | ORAL | 2 refills | Status: AC

## 2021-03-08 MED ORDER — SODIUM CHLORIDE 0.9 % IV SOLN: INTRAVENOUS | Status: DC

## 2021-03-08 MED ORDER — NICOTINE 21 MG/24HR TD PT24
21.0000 mg | MEDICATED_PATCH | Freq: Every day | TRANSDERMAL | Status: DC
  Administered 2021-03-08: 21 mg via TRANSDERMAL
  Filled 2021-03-08: qty 1

## 2021-03-08 MED ORDER — LIDOCAINE 2% (20 MG/ML) 5 ML SYRINGE
INTRAMUSCULAR | Status: DC | PRN
Start: 2021-03-08 — End: 2021-03-08
  Administered 2021-03-08: 80 mg via INTRAVENOUS

## 2021-03-08 MED ORDER — SODIUM CHLORIDE 0.9 % IV SOLN
INTRAVENOUS | Status: DC | PRN
Start: 2021-03-08 — End: 2021-03-08

## 2021-03-08 MED ORDER — PROPOFOL 10 MG/ML IV BOLUS
INTRAVENOUS | Status: DC | PRN
  Administered 2021-03-08: 10 mg via INTRAVENOUS
  Administered 2021-03-08: 20 mg via INTRAVENOUS

## 2021-03-08 MED ORDER — PANTOPRAZOLE SODIUM 40 MG PO TBEC: 40.0000 mg | DELAYED_RELEASE_TABLET | Freq: Every day | ORAL | 11 refills | Status: AC

## 2021-03-08 MED ORDER — PROPOFOL 500 MG/50ML IV EMUL
INTRAVENOUS | Status: DC | PRN
  Administered 2021-03-08: 125 ug/kg/min via INTRAVENOUS

## 2021-03-08 SURGICAL SUPPLY — 15 items

## 2021-03-08 NOTE — Discharge Summary (Signed)
[facility]  Physician Discharge Summary  Jeanette Webb BTY:[id number] DOB: 03/31/1965 DOA: 03/07/2021  PCP: Patient, No Pcp Per (Inactive)  Admit date: 03/07/2021 Discharge date: 03/08/2021  Admitted From: Home Discharge disposition: Home  Recommendations at discharge:  Follow-up with GI as an outpatient Stop alcohol, stopping smoking Your vitamin B12 level is currently low.  You were given 1 dose of IM vitamin B12 shot.  You have been started on oral vitamin B12 tablets.  Need to repeat B12 shots as an outpatient with your primary doctor.   Brief narrative: Jeanette Webb is a 56 y.o. female with PMH significant for anxiety/depression, chronic alcohol use, 1 pack/day smoking. Patient presented to the ED early around midnight last night with complaint of 2 episodes of black tarry stool and 2 episodes of hematemesis.   While waiting in the ER, patient had a near syncopal episode. Patient administered drinking 2-3 drinks of wine and beer, ' not every day'.  Last week, she also took 2 doses of ibuprofen, denies taking it regularly. No history of GI bleeding, endoscopy in the past.  In the ED, patient was afebrile, heart rate in 90s, blood pressure was initially low in 60s which later improved to low 100s. Labs with hemoglobin initially at 11.6 but on repeat was low at 9.9.  Initial lactic acid level was low at 6.5, creatinine slightly elevated at 1.08, glucose level elevated to 279, AST/ALT/alk phos at 49/58/73, total bilirubin 1.5 Fecal occult blood positive. CT abdomen pelvis did not show any acute intra-abdominal pathology. Her blood pressure and lactic acid level improved with IV fluid.  Patient was started on Protonix drip. Admitted to hospitalist service. GI has been consulted  Subjective: Patient was seen and examined this afternoon.  Pleasant.  Sitting up in bed.  Not in distress.  Underwent EGD this morning.  Finding as below  Principal Problem:   Acute GI bleeding Active Problems:    Acute blood loss anemia   Assessment and Plan: Acute GI bleeding, suspect upper GI Moderate-sized hiatal hernia -Presented with melanotic stool, hematemesis in the setting of chronic alcohol use, chronic smoking -FOBT positive. -CT abdomen showed moderate-sized hiatal hernia -She was started on Protonix drip in the ED.  GI consulted. -3/3: Underwent EGD.  Found to have 4 cm hiatal hernia, portal hypertensive gastropathy and duodenitis were biopsied.  GI recommended PPI daily.  Stop smoking, alcohol. -Follow-up with GI as an outpatient for biopsy report.  Acute on chronic macrocytic anemia Vitamin B12 deficiency -Patient seems to have chronic vitamin B12 deficiency probably related to alcoholism.  B12 level low at 98.  IM and oral supplements started -Acute drop in hemoglobin due to acute GI bleeding -Last hemoglobin 9.4 this morning.  Continue to monitor.  Transfuse if less than 7. Recent Labs    03/07/21 0019 03/07/21 0147 03/07/21 0400 03/07/21 0425 03/07/21 0430 03/07/21 0806 03/08/21 0147  HGB 11.6* 9.9* 9.5*  --   --  9.4* 8.3*  MCV 109.5*  --  104.4*  --   --  104.9* 104.3*  VITAMINB12  --   --  98*  --   --   --   --   FOLATE  --   --   --   --  8.2  --   --   FERRITIN  --   --  309*  --   --   --   --   TIBC  --   --  232*  --   --   --   --  IRON  --   --  205*  --   --   --   --   RETICCTPCT  --   --   --  2.4  --   --   --    Near syncopal episode Lactic acidosis -While waiting in the ED, patient had a near syncopal episode.  Her blood pressure was low in 60s.  Lactic acid level drawn immediately was elevated to 6.5 probably because of impaired tissue perfusion. -Blood pressure and lactic acid level improved with hydration. -No evidence of infection/sepsis. Recent Labs  Lab 03/07/21 0019 03/07/21 0400 03/07/21 0806 03/08/21 0147  WBC 11.5* 6.9 6.6 5.8  LATICACIDVEN 6.5* 1.3 0.9  --    Hyperglycemia -No history of diabetes mellitus.  Blood sugar level  initially was over 200.  A1c at 4.9 on 03/07/2021  Chronic alcoholism Hepatic steatosis Elevated liver enzymes -Patient states she drinks 2-3 drinks of wine and beer but not every day.  Questionable reliability.   -In the hospital, patient did not have any withdrawal symptoms. -CT abdomen showed fatty liver.  Likely because of elevated AST and ALT.  Normal alk phos. acute hepatitis panel nonreactive.  Liver enzymes trended down to normal today. Recent Labs  Lab 03/07/21 0019 03/08/21 0147  AST 49* 26  ALT 58* 33  ALKPHOS 73 53  BILITOT 1.5* 0.6  PROT 6.1* 4.7*  ALBUMIN 3.4* 2.8*   Hepatitis Latest Ref Rng & Units 03/07/2021  Hep B Surface Ag NON REACTIVE NON REACTIVE  Hep B IgM NON REACTIVE NON REACTIVE  Hep C Ab NON REACTIVE NON REACTIVE  Hep A IgM NON REACTIVE NON REACTIVE   Chronic smoking -Patient states he smokes about 1 pack/day. -Nicotine patch offered  Anxiety/depression -Does not seem to be on any meds.  If she could be on medicines but then, it may help with substance abuse.     Wounds:  -    Discharge Exam:   Vitals:   03/08/21 1300 03/08/21 1315 03/08/21 1328 03/08/21 1358  BP: 103/76 108/78 111/84 (!) 130/96  Pulse: 73 68 70 77  Resp: $Remo'16 15 16 17  'LCtOL$ Temp: 97.7 F (36.5 C)  97.7 F (36.5 C) 97.6 F (36.4 C)  TempSrc:      SpO2: 100% 98% 99% 99%  Weight:      Height:        Body mass index is 34.05 kg/m.  General exam: Pleasant, middle-aged Caucasian female.  Not in physical distress Skin: No rashes, lesions or ulcers. HEENT: Atraumatic, normocephalic, no obvious bleeding Lungs: Clear to auscultation bilaterally CVS: Regular rate and rhythm, no murmur GI/Abd soft, nontender, nondistended, bowel sound present CNS: Alert, awake, oriented x3 Psychiatry: Mood appropriate Extremities: No pedal edema, no calf tenderness  Follow ups:    Follow-up [facility] up.   Contact information: [address] [place] [address] [phone]                Discharge Instructions:   Discharge Instructions     Call MD for:  difficulty breathing, headache or visual disturbances   Complete by: As directed    Call MD for:  extreme fatigue   Complete by: As directed    Call MD for:  hives   Complete by: As directed    Call MD for:  persistant dizziness or light-headedness   Complete by: As directed    Call MD  for:  persistant nausea and vomiting   Complete by: As directed    Call MD for:  severe uncontrolled pain   Complete by: As directed    Call MD for:  temperature >100.4   Complete by: As directed    Diet general   Complete by: As directed    Discharge instructions   Complete by: As directed    Recommendations at discharge:   Follow-up with GI as an outpatient  Stop alcohol, stopping smoking  Your vitamin B12 level is currently low.  You were given 1 dose of IM vitamin B12 shot.  You have been started on oral vitamin B12 tablets.  Need to repeat B12 shots as an outpatient with your primary doctor.  General discharge instructions: Follow with Primary MD Patient, No Pcp Per (Inactive) in 7 days  Please request your PCP  to go over your hospital tests, procedures, radiology results at the follow up. Please get your medicines reviewed and adjusted.  Your PCP may decide to repeat certain labs or tests as needed. Do not drive, operate heavy machinery, perform activities at heights, swimming or participation in water activities or provide baby sitting services if your were admitted for syncope or siezures until you have seen by Primary MD or a Neurologist and advised to do so again. [place] Controlled Substance Reporting System database was reviewed. Do not drive, operate heavy machinery, perform activities at heights, swim, participate in water activities or provide baby-sitting services while on medications for pain, sleep and mood until your  outpatient physician has reevaluated you and advised to do so again.  You are strongly recommended to comply with the dose, frequency and duration of prescribed medications. Activity: As tolerated with Full fall precautions use walker/cane & assistance as needed Avoid using any recreational substances like cigarette, tobacco, alcohol, or non-prescribed drug. If you experience worsening of your admission symptoms, develop shortness of breath, life threatening emergency, suicidal or homicidal thoughts you must seek medical attention immediately by calling 911 or calling your MD immediately  if symptoms less severe. You must read complete instructions/literature along with all the possible adverse reactions/side effects for all the medicines you take and that have been prescribed to you. Take any new medicine only after you have completely understood and accepted all the possible adverse reactions/side effects.  Wear Seat belts while driving. You were cared for by a hospitalist during your hospital stay. If you have any questions about your discharge medications or the care you received while you were in the hospital after you are discharged, you can call the unit and ask to speak with the hospitalist or the covering physician. Once you are discharged, your primary care physician will handle any further medical issues. Please note that NO REFILLS for any discharge medications will be authorized once you are discharged, as it is imperative that you return to your primary care physician (or establish a relationship with a primary care physician if you do not have one).   Increase activity slowly   Complete by: As directed        Discharge Medications:   Allergies as of 03/08/2021   No Known Allergies      Medication List     TAKE these medications    cyanocobalamin 1000 MCG tablet Take 1 tablet (1,000 mcg total) by mouth daily. Start taking on: March 09, 2021   DRY EYE RELIEF OP Place 2-3 drops  into both eyes 2 (two) times daily as needed (dry  eyes).   ibuprofen 200 MG tablet Commonly known as: ADVIL Take 400 mg by mouth every 6 (six) hours as needed for moderate pain or headache.   pantoprazole 40 MG tablet Commonly known as: Protonix Take 1 tablet (40 mg total) by mouth daily.         The results of significant diagnostics from this hospitalization (including imaging, microbiology, ancillary and laboratory) are listed below for reference.    Procedures and Diagnostic Studies:   CT ABDOMEN PELVIS W CONTRAST  Result Date: 03/07/2021 CLINICAL DATA:  Abdominal pain, acute, nonlocalized.  Vomiting. EXAM: CT ABDOMEN AND PELVIS WITH CONTRAST TECHNIQUE: Multidetector CT imaging of the abdomen and pelvis was performed using the standard protocol following bolus administration of intravenous contrast. RADIATION DOSE REDUCTION: This exam was performed according to the departmental dose-optimization program which includes automated exposure control, adjustment of the mA and/or kV according to patient size and/or use of iterative reconstruction technique. CONTRAST:  12mL OMNIPAQUE IOHEXOL 300 MG/ML  SOLN COMPARISON:  04/27/2013 FINDINGS: Lower chest: Fall moderate-sized hiatal hernia. No acute abnormality. Hepatobiliary: Diffuse low-density throughout the liver compatible with fatty infiltration. No focal abnormality. Gallbladder unremarkable. Pancreas: No focal abnormality or ductal dilatation. Spleen: No focal abnormality.  Normal size. Adrenals/Urinary Tract: No adrenal abnormality. No focal renal abnormality. No stones or hydronephrosis. Urinary bladder is unremarkable. Stomach/Bowel: Stomach, large and small bowel grossly unremarkable. Normal appendix. Vascular/Lymphatic: Aortic atherosclerosis. No evidence of aneurysm or adenopathy. Reproductive: Uterus and adnexa unremarkable.  No mass. Other: No free fluid or free air. Musculoskeletal: No acute bony abnormality. IMPRESSION: No acute  findings in the abdomen or pelvis. Hepatic steatosis. Moderate-sized hiatal hernia. Aortic atherosclerosis. Electronically Signed   By: Rolm Baptise M.D.   On: 03/07/2021 02:02     Labs:   Basic Metabolic Panel: Recent Labs  Lab 03/07/21 0019 03/07/21 0147 03/08/21 0147  NA 136 138 137  K 3.8 4.3 3.7  CL 99 105 108  CO2 17*  --  25  GLUCOSE 279* 216* 112*  BUN 34* 32* 15  CREATININE 1.08* 0.70 0.78  CALCIUM 9.5  --  8.0*   GFR Estimated Creatinine Clearance: 94.9 mL/min (by C-G formula based on SCr of 0.78 mg/dL). Liver Function Tests: Recent Labs  Lab 03/07/21 0019 03/08/21 0147  AST 49* 26  ALT 58* 33  ALKPHOS 73 53  BILITOT 1.5* 0.6  PROT 6.1* 4.7*  ALBUMIN 3.4* 2.8*   Recent Labs  Lab 03/07/21 0019  LIPASE 40   No results for input(s): AMMONIA in the last 168 hours. Coagulation profile Recent Labs  Lab 03/07/21 0019 03/07/21 0400  INR 1.1 1.1    CBC: Recent Labs  Lab 03/07/21 0019 03/07/21 0147 03/07/21 0400 03/07/21 0806 03/08/21 0147  WBC 11.5*  --  6.9 6.6 5.8  NEUTROABS 6.3  --   --   --  2.0  HGB 11.6* 9.9* 9.5* 9.4* 8.3*  HCT 35.9* 29.0* 28.4* 28.0* 24.5*  MCV 109.5*  --  104.4* 104.9* 104.3*  PLT 227  --  168 158 144*   Cardiac Enzymes: No results for input(s): CKTOTAL, CKMB, CKMBINDEX, TROPONINI in the last 168 hours. BNP: Invalid input(s): POCBNP CBG: Recent Labs  Lab 03/07/21 0837 03/07/21 1604 03/07/21 2359 03/08/21 0903  GLUCAP 108* 180* 97 131*   D-Dimer No results for input(s): DDIMER in the last 72 hours. Hgb A1c Recent Labs    03/07/21 0404  HGBA1C 4.9   Lipid Profile No results for input(s): CHOL, HDL,  LDLCALC, TRIG, CHOLHDL, LDLDIRECT in the last 72 hours. Thyroid function studies No results for input(s): TSH, T4TOTAL, T3FREE, THYROIDAB in the last 72 hours.  Invalid input(s): FREET3 Anemia work up Recent Labs    03/07/21 0400 03/07/21 0425 03/07/21 0430  VITAMINB12 98*  --   --   FOLATE  --   --   8.2  FERRITIN 309*  --   --   TIBC 232*  --   --   IRON 205*  --   --   RETICCTPCT  --  2.4  --    Microbiology Recent Results (from the past 240 hour(s))  Resp Panel by RT-PCR (Flu A&B, Covid) Nasopharyngeal Swab     Status: None   Collection Time: 03/07/21  1:30 AM   Specimen: Nasopharyngeal Swab; Nasopharyngeal(NP) swabs in vial transport medium  Result Value Ref Range Status   SARS Coronavirus 2 by RT PCR NEGATIVE NEGATIVE Final    Comment: (NOTE) SARS-CoV-2 target nucleic acids are NOT DETECTED.  The SARS-CoV-2 RNA is generally detectable in upper respiratory specimens during the acute phase of infection. The lowest concentration of SARS-CoV-2 viral copies this assay can detect is 138 copies/mL. A negative result does not preclude SARS-Cov-2 infection and should not be used as the sole basis for treatment or other patient management decisions. A negative result may occur with  improper specimen collection/handling, submission of specimen other than nasopharyngeal swab, presence of viral mutation(s) within the areas targeted by this assay, and inadequate number of viral copies(<138 copies/mL). A negative result must be combined with clinical observations, patient history, and epidemiological information. The expected result is Negative.  Fact Sheet for Patients:  EntrepreneurPulse.com.au  Fact Sheet for Healthcare Providers:  IncredibleEmployment.be  This test is no t yet approved or cleared by the [place] FDA and  has been authorized for detection and/or diagnosis of SARS-CoV-2 by FDA under an Emergency Use Authorization (EUA). This EUA will remain  in effect (meaning this test can be used) for the duration of the COVID-19 declaration under Section 564(b)(1) of the Act, 21 U.S.C.section 360bbb-3(b)(1), unless the authorization is terminated  or revoked sooner.       Influenza A by PCR NEGATIVE NEGATIVE Final   Influenza B by  PCR NEGATIVE NEGATIVE Final    Comment: (NOTE) The Xpert Xpress SARS-CoV-2/FLU/RSV plus assay is intended as an aid in the diagnosis of influenza from Nasopharyngeal swab specimens and should not be used as a sole basis for treatment. Nasal washings and aspirates are unacceptable for Xpert Xpress SARS-CoV-2/FLU/RSV testing.  Fact Sheet for Patients: EntrepreneurPulse.com.au  Fact Sheet for Healthcare Providers: IncredibleEmployment.be  This test is not yet approved or cleared by the [place] FDA and has been authorized for detection and/or diagnosis of SARS-CoV-2 by FDA under an Emergency Use Authorization (EUA). This EUA will remain in effect (meaning this test can be used) for the duration of the COVID-19 declaration under Section 564(b)(1) of the Act, 21 U.S.C. section 360bbb-3(b)(1), unless the authorization is terminated or revoked.  Performed at [facility], [place] [address], [place], [place] [address]     Time coordinating discharge: 35 minutes  Signed: Teletha Petrea  [facility] 03/08/2021, 3:03 PM

## 2021-03-08 NOTE — Op Note (Addendum)
[facility] ?Patient Name: Jeanette Webb ?Procedure Date : 03/08/2021 ?MRN: [id number] ?Attending MD: Jeani Hawking , MD ?Date of Birth: [birth year] ?CSN: [id number] ?Age: 56 ?Admit Type: Inpatient ?Procedure:                Upper GI endoscopy ?Indications:              Melena ?Providers:                Jeani Hawking, MD, Benjaman Lobe, RN, Priscella Mann,  ?                          Technician ?Referring MD:              ?Medicines:                Propofol per Anesthesia ?Complications:            No immediate complications. ?Estimated Blood Loss:     Estimated blood loss: none. ?Procedure:                Pre-Anesthesia Assessment: ?                          - Prior to the procedure, a History and Physical  ?                          was performed, and patient medications and  ?                          allergies were reviewed. The patient's tolerance of  ?                          previous anesthesia was also reviewed. The risks  ?                          and benefits of the procedure and the sedation  ?                          options and risks were discussed with the patient.  ?                          All questions were answered, and informed consent  ?                          was obtained. Prior Anticoagulants: The patient has  ?                          taken no previous anticoagulant or antiplatelet  ?                          agents. ASA Grade Assessment: II - A patient with  ?                          mild systemic disease. After reviewing the risks  ?                          and  benefits, the patient was deemed in  ?                          satisfactory condition to undergo the procedure. ?                          - Sedation was administered by an anesthesia  ?                          professional. Deep sedation was attained. ?                          After obtaining informed consent, the endoscope was  ?                          passed under direct vision. Throughout the  ?                           procedure, the patient's blood pressure, pulse, and  ?                          oxygen saturations were monitored continuously. The  ?                          [id number]-[id number] ([id number]) Olympus endoscope was introduced  ?                          through the mouth, and advanced to the third part  ?                          of duodenum. The upper GI endoscopy was  ?                          accomplished without difficulty. The patient  ?                          tolerated the procedure well. ?Scope In: ?Scope Out: ?Findings: ?     A 4 cm hiatal hernia was present. ?     The gastroesophageal flap valve was visualized endoscopically and  ?     classified as Hill Grade IV (no fold, wide open lumen, hiatal hernia  ?     present). ?     Mild portal hypertensive gastropathy was found in the gastric fundus and  ?     in the gastric body. Biopsies were taken with a cold forceps for  ?     Helicobacter pylori testing. ?     Segmental severe inflammation characterized by congestion (edema),  ?     granularity and shallow ulcerations was found in the duodenal bulb.  ?     Biopsies were taken with a cold forceps for Helicobacter pylori testing. ?     In the antrum and the duodenal bulb there was evidence of some blood.  ?     Careful, slow, and repeated examination of the mucosa in these areas did  ?     not yield an actual bleeding site. Some friability was noted with  the  ?     duodenitis, but no severe bleeding was induced. There was evidence of a  ?     healing ulcer. Gastric biopsies were obtained for H. pylori. ?Impression:               - 4 cm hiatal hernia. ?                          - Gastroesophageal flap valve classified as Hill  ?                          Grade IV (no fold, wide open lumen, hiatal hernia  ?                          present). ?                          - Portal hypertensive gastropathy. Biopsied. ?                          - Duodenitis. Biopsied. ?Recommendation:           - Return patient to hospital  ward for ongoing care. ?                          - Resume regular diet. ?                          - Continue present medications. ?                          - Await pathology results. ?                          - No further ETOH. ?                          - PPI QD. ?                          - Follow up in the office in 2-4 weeks. ?Procedure Code(s):        --- Professional --- ?                          [phone], Esophagogastroduodenoscopy, flexible,  ?                          transoral; with biopsy, single or multiple ?Diagnosis Code(s):        --- Professional --- ?                          K29.80, Duodenitis without bleeding ?                          K31.89, Other diseases of stomach and duodenum ?                          K92.1, Melena (includes Hematochezia) ?  K76.6, Portal hypertension ?                          K44.9, Diaphragmatic hernia without obstruction or  ?                          gangrene ?CPT copyright 2019 American Medical Association. All rights reserved. ?The codes documented in this report are preliminary and upon coder review may  ?be revised to meet current compliance requirements. ?Jeani Hawking, MD ?Jeani Hawking, MD ?03/08/2021 1:12:57 PM ?This report has been signed electronically. ?Number of Addenda: 0 ?

## 2021-03-08 NOTE — Anesthesia Postprocedure Evaluation (Signed)
Anesthesia Post Note ? ?Patient: Jeanette Webb ? ?Procedure(s) Performed: ESOPHAGOGASTRODUODENOSCOPY (EGD) WITH PROPOFOL ?BIOPSY ? ?  ? ?Patient location during evaluation: PACU ?Anesthesia Type: MAC ?Level of consciousness: awake and alert ?Pain management: pain level controlled ?Vital Signs Assessment: post-procedure vital signs reviewed and stable ?Respiratory status: spontaneous breathing, nonlabored ventilation, respiratory function stable and patient connected to nasal cannula oxygen ?Cardiovascular status: stable and blood pressure returned to baseline ?Postop Assessment: no apparent nausea or vomiting ?Anesthetic complications: no ? ? ?No notable events documented. ? ?Last Vitals:  ?Vitals:  ? 03/08/21 0855 03/08/21 1111  ?BP: 108/77 113/76  ?Pulse: 74 80  ?Resp: 16 17  ?Temp: 37.5 ?C (!) 36.2 ?C  ?SpO2: 100% 100%  ?  ?Last Pain:  ?Vitals:  ? 03/08/21 1111  ?TempSrc: Temporal  ?PainSc: 0-No pain  ? ? ?  ?  ?  ?  ?  ?  ? ?Effie Berkshire ? ? ? ? ?

## 2021-03-08 NOTE — Transfer of Care (Signed)
Immediate Anesthesia Transfer of Care Note ? ?Patient: Jeanette Webb ? ?Procedure(s) Performed: ESOPHAGOGASTRODUODENOSCOPY (EGD) WITH PROPOFOL ?BIOPSY ? ?Patient Location: PACU ? ?Anesthesia Type:MAC ? ?Level of Consciousness: awake, oriented, patient cooperative and responds to stimulation ? ?Airway & Oxygen Therapy: Patient Spontanous Breathing and Patient connected to nasal cannula oxygen ? ?Post-op Assessment: Report given to RN and Post -op Vital signs reviewed and stable ? ?Post vital signs: Reviewed and stable ? ?Last Vitals:  ?Vitals Value Taken Time  ?BP 103/76 03/08/21 1259  ?Temp    ?Pulse 66 03/08/21 1302  ?Resp 12 03/08/21 1302  ?SpO2 100 % 03/08/21 1302  ?Vitals shown include unvalidated device data. ? ?Last Pain:  ?Vitals:  ? 03/08/21 1111  ?TempSrc: Temporal  ?PainSc: 0-No pain  ?   ? ?Patients Stated Pain Goal: 0 (03/07/21 0021) ? ?Complications: No notable events documented. ?

## 2021-03-08 NOTE — Anesthesia Preprocedure Evaluation (Signed)
Anesthesia Evaluation  ?Patient identified by MRN, date of birth, ID band ?Patient awake ? ? ? ?Reviewed: ?Allergy & Precautions, H&P , NPO status , Patient's Chart, lab work & pertinent test results ? ?Airway ?Mallampati: II ? ?TM Distance: >3 FB ?Neck ROM: Full ? ? ? Dental ?no notable dental hx. ?(+) Teeth Intact, Dental Advisory Given ?  ?Pulmonary ?neg pulmonary ROS, Current Smoker and Patient abstained from smoking.,  ?  ?Pulmonary exam normal ?breath sounds clear to auscultation ? ? ? ? ? ? Cardiovascular ?negative cardio ROS ? ? ?Rhythm:Regular Rate:Normal ? ? ?  ?Neuro/Psych ?Anxiety Depression negative neurological ROS ?   ? GI/Hepatic ?negative GI ROS, Neg liver ROS,   ?Endo/Other  ?negative endocrine ROS ? Renal/GU ?negative Renal ROS  ?negative genitourinary ?  ?Musculoskeletal ? ? Abdominal ?  ?Peds ? Hematology ? ?(+) Blood dyscrasia, anemia ,   ?Anesthesia Other Findings ? ? Reproductive/Obstetrics ?negative OB ROS ? ?  ? ? ? ? ? ? ? ? ? ? ? ? ? ?  ?  ? ? ? ? ? ? ? ? ?Anesthesia Physical ?Anesthesia Plan ? ?ASA: 2 ? ?Anesthesia Plan: MAC  ? ?Post-op Pain Management: Minimal or no pain anticipated  ? ?Induction: Intravenous ? ?PONV Risk Score and Plan: 1 and Propofol infusion ? ?Airway Management Planned: Natural Airway and Nasal Cannula ? ?Additional Equipment:  ? ?Intra-op Plan:  ? ?Post-operative Plan:  ? ?Informed Consent: I have reviewed the patients History and Physical, chart, labs and discussed the procedure including the risks, benefits and alternatives for the proposed anesthesia with the patient or authorized representative who has indicated his/her understanding and acceptance.  ? ? ? ?Dental advisory given ? ?Plan Discussed with: CRNA ? ?Anesthesia Plan Comments:   ? ? ? ? ? ? ?Anesthesia Quick Evaluation ? ?

## 2021-03-10 ENCOUNTER — Encounter (HOSPITAL_COMMUNITY): Payer: Self-pay | Admitting: Gastroenterology

## 2021-03-12 LAB — SURGICAL PATHOLOGY

## 2021-07-15 ENCOUNTER — Ambulatory Visit: Payer: Self-pay | Admitting: Nurse Practitioner
